# Patient Record
Sex: Female | Born: 1955 | Race: White | Hispanic: No | Smoking: Never smoker
Health system: Southern US, Community
[De-identification: ages and names within clinical notes are randomized; demographics above are authoritative.]

## PROBLEM LIST (undated history)

## (undated) DIAGNOSIS — E785 Hyperlipidemia, unspecified: Secondary | ICD-10-CM

## (undated) DIAGNOSIS — F419 Anxiety disorder, unspecified: Secondary | ICD-10-CM

## (undated) DIAGNOSIS — K219 Gastro-esophageal reflux disease without esophagitis: Secondary | ICD-10-CM

## (undated) DIAGNOSIS — H3530 Unspecified macular degeneration: Secondary | ICD-10-CM

## (undated) HISTORY — PX: ABDOMINAL HYSTERECTOMY: SHX81

## (undated) HISTORY — PX: COLONOSCOPY: SHX174

## (undated) HISTORY — PX: BREAST SURGERY: SHX581

## (undated) HISTORY — PX: CHOLECYSTECTOMY: SHX55

---

## 2016-09-10 ENCOUNTER — Emergency Department (HOSPITAL_BASED_OUTPATIENT_CLINIC_OR_DEPARTMENT_OTHER)
Admission: EM | Admit: 2016-09-10 | Discharge: 2016-09-10 | Disposition: A | Discharge status: Home / Self Care | Payer: Medicaid Other | Attending: Emergency Medicine | Admitting: Emergency Medicine

## 2016-09-10 ENCOUNTER — Emergency Department (HOSPITAL_BASED_OUTPATIENT_CLINIC_OR_DEPARTMENT_OTHER): Payer: Medicaid Other

## 2016-09-10 ENCOUNTER — Encounter (HOSPITAL_BASED_OUTPATIENT_CLINIC_OR_DEPARTMENT_OTHER): Payer: Self-pay | Admitting: *Deleted

## 2016-09-10 DIAGNOSIS — R0981 Nasal congestion: Secondary | ICD-10-CM

## 2016-09-10 DIAGNOSIS — Z79899 Other long term (current) drug therapy: Secondary | ICD-10-CM | POA: Diagnosis not present

## 2016-09-10 DIAGNOSIS — R209 Unspecified disturbances of skin sensation: Secondary | ICD-10-CM | POA: Diagnosis not present

## 2016-09-10 DIAGNOSIS — R4182 Altered mental status, unspecified: Secondary | ICD-10-CM | POA: Diagnosis present

## 2016-09-10 HISTORY — DX: Unspecified macular degeneration: H35.30

## 2016-09-10 HISTORY — DX: Anxiety disorder, unspecified: F41.9

## 2016-09-10 HISTORY — DX: Hyperlipidemia, unspecified: E78.5

## 2016-09-10 LAB — CBC WITH DIFFERENTIAL/PLATELET
BASOS ABS: 0 10*3/uL (ref 0.0–0.1)
BASOS PCT: 0 %
Eosinophils Absolute: 0.1 10*3/uL (ref 0.0–0.7)
Eosinophils Relative: 1 %
HEMATOCRIT: 39.6 % (ref 36.0–46.0)
Hemoglobin: 13.2 g/dL (ref 12.0–15.0)
Lymphocytes Relative: 17 %
Lymphs Abs: 1.9 10*3/uL (ref 0.7–4.0)
MCH: 29.7 pg (ref 26.0–34.0)
MCHC: 33.3 g/dL (ref 30.0–36.0)
MCV: 89.2 fL (ref 78.0–100.0)
Monocytes Absolute: 0.6 10*3/uL (ref 0.1–1.0)
Monocytes Relative: 5 %
NEUTROS ABS: 8.6 10*3/uL — AB (ref 1.7–7.7)
NEUTROS PCT: 77 %
Platelets: 244 10*3/uL (ref 150–400)
RBC: 4.44 MIL/uL (ref 3.87–5.11)
RDW: 13.8 % (ref 11.5–15.5)
WBC: 11.2 10*3/uL — AB (ref 4.0–10.5)

## 2016-09-10 LAB — URINALYSIS, ROUTINE W REFLEX MICROSCOPIC
Bilirubin Urine: NEGATIVE
Glucose, UA: NEGATIVE mg/dL
Hgb urine dipstick: NEGATIVE
Ketones, ur: NEGATIVE mg/dL
Leukocytes, UA: NEGATIVE
Nitrite: NEGATIVE
Protein, ur: NEGATIVE mg/dL
Specific Gravity, Urine: 1.021 (ref 1.005–1.030)
pH: 6 (ref 5.0–8.0)

## 2016-09-10 LAB — ETHANOL

## 2016-09-10 LAB — COMPREHENSIVE METABOLIC PANEL WITH GFR
ALT: 19 U/L (ref 14–54)
AST: 18 U/L (ref 15–41)
Albumin: 3.9 g/dL (ref 3.5–5.0)
Alkaline Phosphatase: 112 U/L (ref 38–126)
Anion gap: 7 (ref 5–15)
BUN: 14 mg/dL (ref 6–20)
CO2: 28 mmol/L (ref 22–32)
Calcium: 9.1 mg/dL (ref 8.9–10.3)
Chloride: 104 mmol/L (ref 101–111)
Creatinine, Ser: 0.76 mg/dL (ref 0.44–1.00)
GFR calc Af Amer: >60 mL/min (ref >60)
GFR calc non Af Amer: >60 mL/min (ref >60)
Glucose, Bld: 109 mg/dL — ABNORMAL HIGH (ref 65–99)
Potassium: 4.3 mmol/L (ref 3.5–5.1)
Sodium: 139 mmol/L (ref 135–145)
Total Bilirubin: 0.2 mg/dL — ABNORMAL LOW (ref 0.3–1.2)
Total Protein: 7.6 g/dL (ref 6.5–8.1)

## 2016-09-10 LAB — RAPID URINE DRUG SCREEN, HOSP PERFORMED
AMPHETAMINES: NOT DETECTED
BARBITURATES: NOT DETECTED
BENZODIAZEPINES: NOT DETECTED
Cocaine: NOT DETECTED
Opiates: NOT DETECTED
Tetrahydrocannabinol: NOT DETECTED

## 2016-09-10 LAB — TROPONIN I: Troponin I: <0.03 ng/mL (ref <0.03)

## 2016-09-10 LAB — MAGNESIUM: Magnesium: 2.1 mg/dL (ref 1.7–2.4)

## 2016-09-10 LAB — CK: Total CK: 60 U/L (ref 38–234)

## 2016-09-10 MED ORDER — LORATADINE 10 MG PO TABS: 10.0000 mg | ORAL_TABLET | Freq: Every day | ORAL | 0 refills | Status: DC

## 2016-09-10 MED ORDER — FLUTICASONE PROPIONATE 50 MCG/ACT NA SUSP: 2.0000 | Freq: Every day | NASAL | 0 refills | Status: DC

## 2016-09-10 NOTE — ED Notes (Signed)
Pt sts she has not had confusion; she felt overwhelmed by all the questions the UC staff was asking her and she just moved to this area, so she can't remember the names of everything yet.

## 2016-09-10 NOTE — ED Triage Notes (Signed)
Pt c/o confusion x 1 week , with hx of same . Pt is alert oriented x 4. Sent here from [facility]

## 2016-09-10 NOTE — ED Provider Notes (Signed)
[place] DEPT [facility] Provider Note   CSN: [id number] Arrival date & time: 09/10/16  1309     History   Chief Complaint Chief Complaint  Patient presents with  . Altered Mental Status    HPI Shelly Beltran is a 61 y.o. female.  HPI Patient states she moved to the area roughly one week ago. She's had "moving sensation" to the left side of her body for the past week. States this is a chronic problem that normally occurs once or twice a month. States she was having the sensation until she arrived to the emergency department. She denies any numbness or weakness. No bug crawling sensation or pins and needle sensation. Denies fever or chills. Denies visual changes, speech changes. No vomiting or diarrhea. No urinary symptoms. Patient states she does have fatigue but this is generalized. Started on Lipitor 9 months ago. No other medications.  Patient admits to some nasal congestion and fullness in sinuses and bilateral ears. Past Medical History:  Diagnosis Date  . Anxiety   . Hyperlipidemia   . Macular degeneration syndrome     There are no active problems to display for this patient.   Past Surgical History:  Procedure Laterality Date  . ABDOMINAL HYSTERECTOMY    . BREAST SURGERY    . CESAREAN SECTION    . CHOLECYSTECTOMY      OB History    No data available       Home Medications    Prior to Admission medications   Medication Sig Start Date End Date Taking? Authorizing Provider  atorvastatin (LIPITOR) 20 MG tablet Take 20 mg by mouth daily.   Yes [provider]  ergocalciferol (VITAMIN D2) 50000 units capsule Take 50,000 Units by mouth once a week.   Yes [provider]  sertraline (ZOLOFT) 25 MG tablet Take 25 mg by mouth daily.   Yes [provider]  fluticasone (FLONASE) 50 MCG/ACT nasal spray Place 2 sprays into both nostrils daily. 09/10/16   Julianne Rice, MD  loratadine (CLARITIN) 10 MG tablet Take 1 tablet (10 mg total) by  mouth daily. 09/10/16   Julianne Rice, MD    Family History No family history on file.  Social History Social History  Substance Use Topics  . Smoking status: Not on file  . Smokeless tobacco: Not on file  . Alcohol use Not on file     Allergies   Patient has no known allergies.   Review of Systems Review of Systems  Constitutional: Positive for fatigue. Negative for appetite change, chills and fever.  HENT: Positive for congestion, sinus pain and sinus pressure. Negative for rhinorrhea and sore throat.   Eyes: Negative for visual disturbance.  Respiratory: Negative for cough, chest tightness, shortness of breath and wheezing.   Cardiovascular: Negative for chest pain, palpitations and leg swelling.  Gastrointestinal: Negative for abdominal pain, constipation, diarrhea, nausea and vomiting.  Genitourinary: Negative for dysuria, flank pain, frequency and hematuria.  Musculoskeletal: Negative for back pain, myalgias, neck pain and neck stiffness.  Skin: Negative for rash and wound.  Neurological: Negative for dizziness, speech difficulty, weakness, light-headedness, numbness and headaches.  All other systems reviewed and are negative.    Physical Exam Updated Vital Signs BP (!) 139/98   Pulse 65   Temp 98.1 F (36.7 C) (Oral)   Resp (!) 23   SpO2 96%   Physical Exam  Constitutional: She is oriented to person, place, and time. She appears well-developed and well-nourished.  Appears anxious  HENT:  Head: Normocephalic and atraumatic.  Mouth/Throat: Oropharynx is clear and moist. No oropharyngeal exudate.  Left greater than right nasal mucosal edema.  Eyes: EOM are normal. Pupils are equal, round, and reactive to light.  Neck: Normal range of motion. Neck supple.  No meningismus.  Cardiovascular: Normal rate and regular rhythm.  Exam reveals no gallop and no friction rub.   No murmur heard. Pulmonary/Chest: Effort normal and breath sounds normal. No respiratory  distress. She has no wheezes. She has no rales. She exhibits no tenderness.  Abdominal: Soft. Bowel sounds are normal. There is no tenderness. There is no rebound and no guarding.  Musculoskeletal: Normal range of motion. She exhibits no edema, tenderness or deformity.  No midline thoracic or lumbar tenderness to palpation. No CVA tenderness bilaterally. No lower extremity swelling or asymmetry.  Lymphadenopathy:    She has no cervical adenopathy.  Neurological: She is alert and oriented to person, place, and time.  Patient is alert and oriented x3 with clear, goal oriented speech. Patient has 5/5 motor in all extremities. Sensation is intact to light touch. Bilateral finger-to-nose is normal with no signs of dysmetria. Patient has a normal gait and walks without assistance.  Skin: Skin is warm and dry. Capillary refill takes less than 2 seconds. No rash noted. She is not diaphoretic. No erythema.  Psychiatric: Her behavior is normal.  Nursing note and vitals reviewed.    ED Treatments / Results  Labs (all labs ordered are listed, but only abnormal results are displayed) Labs Reviewed  CBC WITH DIFFERENTIAL/PLATELET - Abnormal; Notable for the following:       Result Value   WBC 11.2 (*)    Neutro Abs 8.6 (*)    All other components within normal limits  COMPREHENSIVE METABOLIC PANEL - Abnormal; Notable for the following:    Glucose, Bld 109 (*)    Total Bilirubin 0.2 (*)    All other components within normal limits  TROPONIN I  URINALYSIS, ROUTINE W REFLEX MICROSCOPIC  RAPID URINE DRUG SCREEN, HOSP PERFORMED  ETHANOL  CK  MAGNESIUM    EKG  EKG Interpretation  Date/Time:  Friday Sep 10 2016 14:50:37 EDT Ventricular Rate:  59 PR Interval:    QRS Duration: 74 QT Interval:  382 QTC Calculation: 379 R Axis:   42 Text Interpretation:  Sinus rhythm Confirmed by Lita Mains  MD, Fareeha Evon ([id number]) on 09/10/2016 3:34:15 PM       Radiology Dg Chest 2 View  Result Date:  09/10/2016 CLINICAL DATA:  Chest discomfort for a month EXAM: CHEST  2 VIEW COMPARISON:  None. FINDINGS: The heart size and mediastinal contours are within normal limits. Both lungs are clear. The visualized skeletal structures are unremarkable. IMPRESSION: No active cardiopulmonary disease. Electronically Signed   By: Kathreen Devoid   On: 09/10/2016 14:53    Procedures Procedures (including critical care time)  Medications Ordered in ED Medications - No data to display   Initial Impression / Assessment and Plan / ED Course  I have reviewed the triage vital signs and the nursing notes.  Pertinent labs & imaging results that were available during my care of the patient were reviewed by me and considered in my medical decision making (see chart for details).     Mild elevation in white blood cell count of uncertain significance. Possibly related to sinusitis. Really for the patient's sensory disturbance, electrolytes are normal. She has a normal neurologic exam. Question whether this may be anxiety related. Chest warm  to follow-up with her primary physician and will give neurology consult. Return precautions have been given.  Final Clinical Impressions(s) / ED Diagnoses   Final diagnoses:  Nasal sinus congestion  Sensory disturbance    New Prescriptions New Prescriptions   FLUTICASONE (FLONASE) 50 MCG/ACT NASAL SPRAY    Place 2 sprays into both nostrils daily.   LORATADINE (CLARITIN) 10 MG TABLET    Take 1 tablet (10 mg total) by mouth daily.     Julianne Rice, MD 09/10/16 [phone]

## 2016-10-07 ENCOUNTER — Encounter (HOSPITAL_COMMUNITY): Payer: Self-pay | Admitting: Emergency Medicine

## 2016-10-07 ENCOUNTER — Emergency Department (HOSPITAL_COMMUNITY): Payer: Medicaid Other

## 2016-10-07 ENCOUNTER — Emergency Department (HOSPITAL_COMMUNITY)
Admission: EM | Admit: 2016-10-07 | Discharge: 2016-10-08 | Disposition: A | Discharge status: Home / Self Care | Payer: Medicaid Other | Attending: Emergency Medicine | Admitting: Emergency Medicine

## 2016-10-07 ENCOUNTER — Other Ambulatory Visit: Payer: Self-pay

## 2016-10-07 DIAGNOSIS — R1013 Epigastric pain: Secondary | ICD-10-CM | POA: Diagnosis not present

## 2016-10-07 DIAGNOSIS — Z79899 Other long term (current) drug therapy: Secondary | ICD-10-CM | POA: Insufficient documentation

## 2016-10-07 DIAGNOSIS — R0789 Other chest pain: Secondary | ICD-10-CM | POA: Diagnosis not present

## 2016-10-07 DIAGNOSIS — R079 Chest pain, unspecified: Secondary | ICD-10-CM

## 2016-10-07 DIAGNOSIS — R42 Dizziness and giddiness: Secondary | ICD-10-CM | POA: Diagnosis present

## 2016-10-07 LAB — CBC
HCT: 40.2 % (ref 36.0–46.0)
Hemoglobin: 13.2 g/dL (ref 12.0–15.0)
MCH: 29.2 pg (ref 26.0–34.0)
MCHC: 32.8 g/dL (ref 30.0–36.0)
MCV: 88.9 fL (ref 78.0–100.0)
Platelets: 246 10*3/uL (ref 150–400)
RBC: 4.52 MIL/uL (ref 3.87–5.11)
RDW: 13.5 % (ref 11.5–15.5)
WBC: 12.5 10*3/uL — ABNORMAL HIGH (ref 4.0–10.5)

## 2016-10-07 LAB — BASIC METABOLIC PANEL
Anion gap: 7 (ref 5–15)
BUN: 20 mg/dL (ref 6–20)
CALCIUM: 9.2 mg/dL (ref 8.9–10.3)
CO2: 24 mmol/L (ref 22–32)
CREATININE: 0.85 mg/dL (ref 0.44–1.00)
Chloride: 110 mmol/L (ref 101–111)
GFR calc Af Amer: >60 mL/min (ref >60)
GLUCOSE: 130 mg/dL — AB (ref 65–99)
Potassium: 3.8 mmol/L (ref 3.5–5.1)
Sodium: 141 mmol/L (ref 135–145)

## 2016-10-07 LAB — POCT I-STAT TROPONIN I
TROPONIN I, POC: 0 ng/mL (ref 0.00–0.08)
Troponin i, poc: 0 ng/mL (ref 0.00–0.08)

## 2016-10-07 NOTE — ED Provider Notes (Signed)
[place] DEPT Provider Note   CSN: [id number] Arrival date & time: 10/07/16  1853     History   Chief Complaint Chief Complaint  Patient presents with  . Dizziness  . Chest Pain    HPI Shelly Beltran is a 61 y.o. female.  Patient with a history of HTN, HLD, known carotid stenosis ("50% on left),  presents with symptoms of substernal burning sensation. She describes brief episodes last seconds and are associated with dizziness. She has been belching more today with a remote history of reflux. She denies SOB, nausea, diaphoresis. She is cnocerned that symptoms are heart related and that she may be having a heart attack. She has had similar symptoms in the past but reports today that they have been frequent where before she may have one episode. There are no aggravating or alleviating factors. She is currently pain free.   The history is provided by the patient. No language interpreter was used.  Dizziness  Associated symptoms: chest pain   Associated symptoms: no headaches, no nausea, no shortness of breath and no vomiting   Chest Pain   Associated symptoms include dizziness. Pertinent negatives include no abdominal pain, no fever, no headaches, no nausea, no shortness of breath and no vomiting.    Past Medical History:  Diagnosis Date  . Anxiety   . Hyperlipidemia   . Macular degeneration syndrome     There are no active problems to display for this patient.   Past Surgical History:  Procedure Laterality Date  . ABDOMINAL HYSTERECTOMY    . BREAST SURGERY    . CESAREAN SECTION    . CHOLECYSTECTOMY      OB History    No data available       Home Medications    Prior to Admission medications   Medication Sig Start Date End Date Taking? Authorizing Provider  atorvastatin (LIPITOR) 20 MG tablet Take 20 mg by mouth at bedtime.    Yes [provider]  fluticasone (FLONASE) 50 MCG/ACT nasal spray Place 2 sprays into both nostrils daily. Patient taking  differently: Place 2 sprays into both nostrils daily as needed for rhinitis.  09/10/16  Yes Julianne Rice, MD  Multiple Vitamin (MULTIVITAMIN WITH MINERALS) TABS tablet Take 1 tablet by mouth daily.   Yes [provider]  sertraline (ZOLOFT) 50 MG tablet Take 50 mg by mouth daily.   Yes [provider]    Family History History reviewed. No pertinent family history.  Social History Social History  Substance Use Topics  . Smoking status: Never Smoker  . Smokeless tobacco: Never Used  . Alcohol use Yes     Comment: social     Allergies   Patient has no known allergies.   Review of Systems Review of Systems  Constitutional: Negative for activity change, chills and fever.  HENT: Negative.   Respiratory: Negative.  Negative for shortness of breath.   Cardiovascular: Positive for chest pain.  Gastrointestinal: Negative.  Negative for abdominal pain, nausea and vomiting.  Musculoskeletal: Negative.   Skin: Negative.   Neurological: Positive for dizziness. Negative for headaches.     Physical Exam Updated Vital Signs BP (!) 143/92 (BP Location: Left Arm)   Pulse 71   Temp 98.7 F (37.1 C) (Oral)   Resp 14   Ht 5\' 1"  (1.549 m)   Wt 81.9 kg (180 lb 9.6 oz)   SpO2 98%   BMI 34.12 kg/m   Physical Exam  Constitutional: She appears well-developed  and well-nourished.  HENT:  Head: Normocephalic.  Neck: Normal range of motion. Neck supple.  Cardiovascular: Normal rate and regular rhythm.   No murmur heard. Pulmonary/Chest: Effort normal and breath sounds normal. She has no wheezes. She has no rales.  Abdominal: Soft. Bowel sounds are normal. There is no tenderness. There is no rebound and no guarding.  Musculoskeletal: Normal range of motion. She exhibits no edema.  Neurological: She is alert.  CN's 3-12 grossly intact. Speech is clear and focused. No facial asymmetry. No deficits of coordination. Ambulatory without imbalance.    Skin: Skin is warm and  dry. No rash noted.  Psychiatric: She has a normal mood and affect.     ED Treatments / Results  Labs (all labs ordered are listed, but only abnormal results are displayed) Labs Reviewed  BASIC METABOLIC PANEL - Abnormal; Notable for the following:       Result Value   Glucose, Bld 130 (*)    All other components within normal limits  CBC - Abnormal; Notable for the following:    WBC 12.5 (*)    All other components within normal limits  I-STAT TROPOININ, ED  POCT I-STAT TROPONIN I   Results for orders placed or performed during the hospital encounter of 78/29/56  Basic metabolic panel  Result Value Ref Range   Sodium 141 135 - 145 mmol/L   Potassium 3.8 3.5 - 5.1 mmol/L   Chloride 110 101 - 111 mmol/L   CO2 24 22 - 32 mmol/L   Glucose, Bld 130 (H) 65 - 99 mg/dL   BUN 20 6 - 20 mg/dL   Creatinine, Ser 0.85 0.44 - 1.00 mg/dL   Calcium 9.2 8.9 - 10.3 mg/dL   GFR calc non Af Amer >60 >60 mL/min   GFR calc Af Amer >60 >60 mL/min   Anion gap 7 5 - 15  CBC  Result Value Ref Range   WBC 12.5 (H) 4.0 - 10.5 K/uL   RBC 4.52 3.87 - 5.11 MIL/uL   Hemoglobin 13.2 12.0 - 15.0 g/dL   HCT 40.2 36.0 - 46.0 %   MCV 88.9 78.0 - 100.0 fL   MCH 29.2 26.0 - 34.0 pg   MCHC 32.8 30.0 - 36.0 g/dL   RDW 13.5 11.5 - 15.5 %   Platelets 246 150 - 400 K/uL  POCT i-Stat troponin I  Result Value Ref Range   Troponin i, poc 0.00 0.00 - 0.08 ng/mL   Comment 3            EKG  EKG Interpretation None       Radiology Dg Chest 2 View  Result Date: 10/07/2016 CLINICAL DATA:  Burning sensation in chest multiple times today since 1300 hours, associated dizziness EXAM: CHEST  2 VIEW COMPARISON:  09/10/2016 FINDINGS: Normal heart size, mediastinal contours, and pulmonary vascularity. Minimal peribronchial thickening. No pulmonary infiltrate, pleural effusion, or pneumothorax. Bones unremarkable. IMPRESSION: Minimal bronchitic changes without infiltrate. Electronically Signed   By: Lavonia Dana M.D.    On: 10/07/2016 19:54    Procedures Procedures (including critical care time)  Medications Ordered in ED Medications - No data to display   Initial Impression / Assessment and Plan / ED Course  I have reviewed the triage vital signs and the nursing notes.  Pertinent labs & imaging results that were available during my care of the patient were reviewed by me and considered in my medical decision making (see chart for details).     Patient  presents with complaint of burning sensation in her chest that has been episodic, brief in duration and associated with dizziness.   Symptoms are not typical for cardiac origin. Troponin negative, CXR clear. EKG has no evidence of ischemic change. The patient is currently asymptomatic.   Delta troponin and repeat EKG are unchanged. The patient was given a GI cocktail with minor improvement in symptoms. She is felt appropriate for discharge home with PCP follow up.  Final Clinical Impressions(s) / ED Diagnoses   Final diagnoses:  None   1. Nonspecific chest pain 2. Dyspepsia  New Prescriptions New Prescriptions   No medications on file     Charlann Lange, Hershal Coria 10/08/16 [id number]    Gareth Morgan, MD 10/08/16 1427

## 2016-10-07 NOTE — ED Notes (Signed)
Pt is having intermittent episodes of "feeling dizzy and still" as well as burning epigastric pain that is "moving." Pt is also worried about her artery that is about 50% occluded, and is going to see her PCP for a scan

## 2016-10-07 NOTE — ED Triage Notes (Signed)
Pt reports having a burning sensation in chest multiple times today starting at 1300 along with dizziness at same time. Pt reports prior hx of having 50% blockage to left carotid artery.

## 2016-10-08 MED ORDER — GI COCKTAIL ~~LOC~~
30.0000 mL | Freq: Once | ORAL | Status: AC
  Administered 2016-10-08: 30 mL via ORAL
  Filled 2016-10-08: qty 30

## 2016-10-08 MED ORDER — OMEPRAZOLE 20 MG PO CPDR: 20.0000 mg | DELAYED_RELEASE_CAPSULE | Freq: Every day | ORAL | 0 refills | Status: DC

## 2016-10-08 NOTE — Discharge Instructions (Signed)
Please follow up with your doctor for recheck of current symptoms. Recommend Prilosec daily symptom relief. Return to the emergency department with any new or concerning symptoms.

## 2017-02-14 ENCOUNTER — Ambulatory Visit (HOSPITAL_COMMUNITY)
Admission: EM | Admit: 2017-02-14 | Discharge: 2017-02-14 | Disposition: A | Discharge status: Home / Self Care | Payer: Medicaid Other | Attending: Family Medicine | Admitting: Family Medicine

## 2017-02-14 ENCOUNTER — Encounter (HOSPITAL_COMMUNITY): Payer: Self-pay | Admitting: Emergency Medicine

## 2017-02-14 DIAGNOSIS — T754XXA Electrocution, initial encounter: Secondary | ICD-10-CM | POA: Diagnosis not present

## 2017-02-14 MED ORDER — SILVER SULFADIAZINE 1 % EX CREA: 1.0000 "application " | TOPICAL_CREAM | Freq: Every day | CUTANEOUS | 0 refills | Status: DC
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
## 2017-02-17 ENCOUNTER — Encounter (HOSPITAL_COMMUNITY): Payer: Self-pay | Admitting: Emergency Medicine

## 2017-02-17 ENCOUNTER — Ambulatory Visit (HOSPITAL_COMMUNITY)
Admission: EM | Admit: 2017-02-17 | Discharge: 2017-02-17 | Disposition: A | Discharge status: Home / Self Care | Payer: Medicaid Other | Attending: Emergency Medicine | Admitting: Emergency Medicine

## 2017-02-17 ENCOUNTER — Ambulatory Visit (INDEPENDENT_AMBULATORY_CARE_PROVIDER_SITE_OTHER): Payer: Medicaid Other

## 2017-02-17 DIAGNOSIS — M25521 Pain in right elbow: Secondary | ICD-10-CM

## 2017-02-17 NOTE — Discharge Instructions (Signed)
Negative X-Ray today. Rest of the right arm, ice application, ACE wrap if needed, ibuprofen (take with food). If pain continues to increase, develop swelling, worsening of numbness or tingling please be seen immediately. If symptoms persist continue to follow with your primary care provider as needed.
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
## 2017-04-19 ENCOUNTER — Encounter (HOSPITAL_COMMUNITY): Payer: Self-pay | Admitting: *Deleted

## 2017-04-19 ENCOUNTER — Ambulatory Visit (HOSPITAL_COMMUNITY)
Admission: EM | Admit: 2017-04-19 | Discharge: 2017-04-19 | Disposition: A | Discharge status: Home / Self Care | Payer: Medicaid Other | Attending: Physician Assistant | Admitting: Physician Assistant

## 2017-04-19 ENCOUNTER — Other Ambulatory Visit: Payer: Self-pay

## 2017-04-19 DIAGNOSIS — L089 Local infection of the skin and subcutaneous tissue, unspecified: Secondary | ICD-10-CM

## 2017-04-19 DIAGNOSIS — R002 Palpitations: Secondary | ICD-10-CM | POA: Diagnosis not present

## 2017-04-19 LAB — POCT I-STAT, CHEM 8
BUN: 17 mg/dL (ref 6–20)
CHLORIDE: 105 mmol/L (ref 101–111)
CREATININE: 0.6 mg/dL (ref 0.44–1.00)
Calcium, Ion: 1.21 mmol/L (ref 1.15–1.40)
Glucose, Bld: 104 mg/dL — ABNORMAL HIGH (ref 65–99)
HEMATOCRIT: 42 % (ref 36.0–46.0)
Hemoglobin: 14.3 g/dL (ref 12.0–15.0)
POTASSIUM: 3.9 mmol/L (ref 3.5–5.1)
Sodium: 141 mmol/L (ref 135–145)
TCO2: 24 mmol/L (ref 22–32)

## 2017-04-19 MED ORDER — DOXYCYCLINE HYCLATE 100 MG PO CAPS: 100.0000 mg | ORAL_CAPSULE | Freq: Two times a day (BID) | ORAL | 0 refills | Status: AC

## 2017-04-19 NOTE — ED Triage Notes (Signed)
Per pt her head feels "stopped up". Per pt left side of her chest is fluttering which keeps coming and going,

## 2017-04-19 NOTE — Discharge Instructions (Signed)
Call the cardiologist to set up an appointment.

## 2017-04-19 NOTE — ED Provider Notes (Signed)
04/19/2017 6:51 PM   DOB: April 30, 1955 / MRN: [id number]  SUBJECTIVE:  Shelly Beltran is a 62 y.o. female presenting for palpitations.  She denies chest pain and shortness of breath with these.  She can walk without difficulty.  She had a son die at 66 years of age due to an enlarged heart. No difficulty with ambulation with regards to SOB.   She complains of some places on her breast that have been bothering her for weeks.  They are painful and she has been applying neosporin to this without improvement.     She has No Known Allergies.   She  has a past medical history of Anxiety, Hyperlipidemia, and Macular degeneration syndrome.    She  reports that  has never smoked. she has never used smokeless tobacco. She reports that she drinks alcohol. She reports that she does not use drugs. She  has no sexual activity history on file. The patient  has a past surgical history that includes Cesarean section; Breast surgery; Cholecystectomy; and Abdominal hysterectomy.  Her family history is not on file.  Review of Systems  Constitutional: Negative for chills and fever.  Cardiovascular: Positive for palpitations. Negative for chest pain, orthopnea, claudication, leg swelling and PND.  Skin: Positive for rash. Negative for itching.    OBJECTIVE:  BP 135/68 (BP Location: Left Arm)   Pulse 80   Temp 98 F (36.7 C) (Oral)   SpO2 98%   Physical Exam  Constitutional: She is active.  Non-toxic appearance.  Cardiovascular: Normal rate, regular rhythm, S1 normal, S2 normal, normal heart sounds and intact distal pulses. Exam reveals no gallop, no friction rub and no decreased pulses.  No murmur heard. Pulmonary/Chest: Effort normal. No stridor. No tachypnea. No respiratory distress. She has no wheezes. She has no rales.    Abdominal: She exhibits no distension.  Musculoskeletal: She exhibits no edema.  Neurological: She is alert.  Skin: Skin is warm and dry. She is not diaphoretic. No pallor.    No  results found for this or any previous visit (from the past 72 hour(s)).  No results found.  ASSESSMENT AND PLAN:  The primary encounter diagnosis was Palpitations. A diagnosis of Pustules determined by examination was also pertinent to this visit.    The patient is advised to call or return to clinic if she does not see an improvement in symptoms, or to seek the care of the closest emergency department if she worsens with the above plan.   Philis Fendt, MHS, PA-C 04/19/2017 6:51 PM    Tereasa Coop, PA-C 04/19/17 [phone]

## 2017-06-29 ENCOUNTER — Other Ambulatory Visit (HOSPITAL_COMMUNITY): Payer: Self-pay | Admitting: Oculoplastics Ophthalmology

## 2017-06-29 DIAGNOSIS — H0589 Other disorders of orbit: Secondary | ICD-10-CM

## 2017-07-04 ENCOUNTER — Other Ambulatory Visit (HOSPITAL_COMMUNITY): Payer: Self-pay | Admitting: Oculoplastics Ophthalmology

## 2017-07-04 DIAGNOSIS — H0589 Other disorders of orbit: Secondary | ICD-10-CM

## 2017-07-08 ENCOUNTER — Ambulatory Visit (HOSPITAL_COMMUNITY): Payer: Medicaid Other

## 2017-07-12 ENCOUNTER — Ambulatory Visit (HOSPITAL_COMMUNITY): Payer: Medicaid Other

## 2017-07-19 ENCOUNTER — Ambulatory Visit (HOSPITAL_COMMUNITY): Payer: Medicaid Other

## 2017-07-28 ENCOUNTER — Other Ambulatory Visit (HOSPITAL_COMMUNITY): Payer: Self-pay | Admitting: Oculoplastics Ophthalmology

## 2017-07-28 ENCOUNTER — Ambulatory Visit (HOSPITAL_COMMUNITY)
Admission: RE | Admit: 2017-07-28 | Discharge: 2017-07-28 | Disposition: A | Discharge status: Home / Self Care | Payer: Medicare Other | Source: Ambulatory Visit | Attending: Oculoplastics Ophthalmology | Admitting: Oculoplastics Ophthalmology

## 2017-07-28 DIAGNOSIS — H0589 Other disorders of orbit: Secondary | ICD-10-CM

## 2017-07-28 LAB — POCT I-STAT CREATININE: Creatinine, Ser: 0.6 mg/dL (ref 0.44–1.00)

## 2017-07-28 MED ORDER — IOPAMIDOL (ISOVUE-M 300) INJECTION 61%: 15.0000 mL | Freq: Once | INTRAMUSCULAR | Status: DC | PRN

## 2017-07-28 MED ORDER — IOPAMIDOL (ISOVUE-300) INJECTION 61%
75.0000 mL | Freq: Once | INTRAVENOUS | Status: AC | PRN
  Administered 2017-07-28: 75 mL via INTRAVENOUS

## 2017-07-28 MED ORDER — IOPAMIDOL (ISOVUE-300) INJECTION 61%
INTRAVENOUS | Status: AC
  Filled 2017-07-28: qty 100

## 2017-09-21 ENCOUNTER — Other Ambulatory Visit: Payer: Self-pay

## 2017-09-21 ENCOUNTER — Encounter (HOSPITAL_COMMUNITY): Payer: Self-pay | Admitting: *Deleted

## 2017-09-21 NOTE — Progress Notes (Signed)
Spoke with pt for pre-op call. Pt denies cardiac history, HTN, or diabetes. Pt states she was seen by her internal medicine doctor yesterday for constipation and possible carotid artery stenosis. Notes by her physician state that pt came in thinking she had been told she had stenosis, but her physician can't find where she had a dopplers done in the past and pt can't remember where it was. In 9 notes he states he does not hear a bruit and she has no symptoms of stenosis. They plan to have her have dopplers done in the future.

## 2017-09-22 ENCOUNTER — Encounter (HOSPITAL_COMMUNITY): Payer: Self-pay

## 2017-09-22 ENCOUNTER — Ambulatory Visit (HOSPITAL_COMMUNITY)
Admission: RE | Admit: 2017-09-22 | Discharge: 2017-09-22 | Disposition: A | Discharge status: Home / Self Care | Payer: Medicare Other | Source: Ambulatory Visit | Attending: Oculoplastics Ophthalmology | Admitting: Oculoplastics Ophthalmology

## 2017-09-22 ENCOUNTER — Ambulatory Visit (HOSPITAL_COMMUNITY): Payer: Medicare Other | Admitting: Certified Registered"

## 2017-09-22 ENCOUNTER — Encounter (HOSPITAL_COMMUNITY)
Admission: RE | Disposition: A | Discharge status: Home / Self Care | Payer: Self-pay | Source: Ambulatory Visit | Attending: Oculoplastics Ophthalmology

## 2017-09-22 DIAGNOSIS — Z79899 Other long term (current) drug therapy: Secondary | ICD-10-CM | POA: Diagnosis not present

## 2017-09-22 DIAGNOSIS — D170 Benign lipomatous neoplasm of skin and subcutaneous tissue of head, face and neck: Secondary | ICD-10-CM | POA: Insufficient documentation

## 2017-09-22 DIAGNOSIS — K219 Gastro-esophageal reflux disease without esophagitis: Secondary | ICD-10-CM | POA: Diagnosis not present

## 2017-09-22 HISTORY — PX: RECONSTRUCTION OF EYELID: SHX6576

## 2017-09-22 HISTORY — DX: Gastro-esophageal reflux disease without esophagitis: K21.9

## 2017-09-22 HISTORY — PX: ORBITAL LESION EXCISION: SHX6682

## 2017-09-22 LAB — BASIC METABOLIC PANEL
ANION GAP: 8 (ref 5–15)
BUN: 15 mg/dL (ref 6–20)
CHLORIDE: 110 mmol/L (ref 101–111)
CO2: 24 mmol/L (ref 22–32)
CREATININE: 0.67 mg/dL (ref 0.44–1.00)
Calcium: 8.8 mg/dL — ABNORMAL LOW (ref 8.9–10.3)
GFR calc non Af Amer: >60 mL/min (ref >60)
Glucose, Bld: 100 mg/dL — ABNORMAL HIGH (ref 65–99)
Potassium: 3.7 mmol/L (ref 3.5–5.1)
SODIUM: 142 mmol/L (ref 135–145)

## 2017-09-22 LAB — PROTIME-INR
INR: 1.13
Prothrombin Time: 14.4 seconds (ref 11.4–15.2)

## 2017-09-22 LAB — CBC
HEMATOCRIT: 41.4 % (ref 36.0–46.0)
HEMOGLOBIN: 13.2 g/dL (ref 12.0–15.0)
MCH: 28.1 pg (ref 26.0–34.0)
MCHC: 31.9 g/dL (ref 30.0–36.0)
MCV: 88.3 fL (ref 78.0–100.0)
Platelets: 218 10*3/uL (ref 150–400)
RBC: 4.69 MIL/uL (ref 3.87–5.11)
RDW: 12.9 % (ref 11.5–15.5)
WBC: 8.9 10*3/uL (ref 4.0–10.5)

## 2017-09-22 SURGERY — RECONSTRUCTION, EYELID
Anesthesia: General | Site: Eye | Laterality: Right

## 2017-09-22 MED ORDER — HYDROMORPHONE HCL 2 MG/ML IJ SOLN: 0.2500 mg | INTRAMUSCULAR | Status: DC | PRN

## 2017-09-22 MED ORDER — LIDOCAINE 2% (20 MG/ML) 5 ML SYRINGE
INTRAMUSCULAR | Status: AC
Start: 2017-09-22 — End: ?
  Filled 2017-09-22: qty 5

## 2017-09-22 MED ORDER — PROPOFOL 10 MG/ML IV BOLUS
INTRAVENOUS | Status: AC
  Filled 2017-09-22: qty 20

## 2017-09-22 MED ORDER — MIDAZOLAM HCL 5 MG/5ML IJ SOLN
INTRAMUSCULAR | Status: DC | PRN
  Administered 2017-09-22: 2 mg via INTRAVENOUS

## 2017-09-22 MED ORDER — TOBRAMYCIN-DEXAMETHASONE 0.3-0.1 % OP OINT
TOPICAL_OINTMENT | OPHTHALMIC | Status: AC
  Filled 2017-09-22: qty 3.5

## 2017-09-22 MED ORDER — FENTANYL CITRATE (PF) 250 MCG/5ML IJ SOLN
INTRAMUSCULAR | Status: AC
Start: 2017-09-22 — End: ?
  Filled 2017-09-22: qty 5

## 2017-09-22 MED ORDER — MIDAZOLAM HCL 2 MG/2ML IJ SOLN
INTRAMUSCULAR | Status: AC
  Filled 2017-09-22: qty 2

## 2017-09-22 MED ORDER — LIDOCAINE 2% (20 MG/ML) 5 ML SYRINGE
INTRAMUSCULAR | Status: DC | PRN
  Administered 2017-09-22: 80 mg via INTRAVENOUS

## 2017-09-22 MED ORDER — PROPOFOL 10 MG/ML IV BOLUS
INTRAVENOUS | Status: DC | PRN
  Administered 2017-09-22: 140 mg via INTRAVENOUS

## 2017-09-22 MED ORDER — FENTANYL CITRATE (PF) 250 MCG/5ML IJ SOLN
INTRAMUSCULAR | Status: DC | PRN
  Administered 2017-09-22: 50 ug via INTRAVENOUS

## 2017-09-22 MED ORDER — PHENYLEPHRINE 40 MCG/ML (10ML) SYRINGE FOR IV PUSH (FOR BLOOD PRESSURE SUPPORT)
PREFILLED_SYRINGE | INTRAVENOUS | Status: AC
  Filled 2017-09-22: qty 10

## 2017-09-22 MED ORDER — ROCURONIUM BROMIDE 10 MG/ML (PF) SYRINGE
PREFILLED_SYRINGE | INTRAVENOUS | Status: AC
  Filled 2017-09-22: qty 5

## 2017-09-22 MED ORDER — LIDOCAINE-EPINEPHRINE 1 %-1:100000 IJ SOLN
INTRAMUSCULAR | Status: AC
Start: 2017-09-22 — End: ?
  Filled 2017-09-22: qty 1

## 2017-09-22 MED ORDER — BUPIVACAINE HCL (PF) 0.5 % IJ SOLN
INTRAMUSCULAR | Status: AC
  Filled 2017-09-22: qty 10

## 2017-09-22 MED ORDER — SUGAMMADEX SODIUM 200 MG/2ML IV SOLN
INTRAVENOUS | Status: DC | PRN
  Administered 2017-09-22: 350 mg via INTRAVENOUS

## 2017-09-22 MED ORDER — LACTATED RINGERS IV SOLN: INTRAVENOUS | Status: DC | PRN

## 2017-09-22 MED ORDER — DEXAMETHASONE SODIUM PHOSPHATE 10 MG/ML IJ SOLN
INTRAMUSCULAR | Status: DC | PRN
  Administered 2017-09-22: 10 mg via INTRAVENOUS

## 2017-09-22 MED ORDER — BUPIVACAINE HCL 0.5 % IJ SOLN
INTRAMUSCULAR | Status: DC | PRN
  Administered 2017-09-22: 1 mL

## 2017-09-22 MED ORDER — DEXAMETHASONE SODIUM PHOSPHATE 10 MG/ML IJ SOLN
INTRAMUSCULAR | Status: AC
  Filled 2017-09-22: qty 1

## 2017-09-22 MED ORDER — 0.9 % SODIUM CHLORIDE (POUR BTL) OPTIME
TOPICAL | Status: DC | PRN
  Administered 2017-09-22: 1000 mL

## 2017-09-22 MED ORDER — ROCURONIUM BROMIDE 10 MG/ML (PF) SYRINGE
PREFILLED_SYRINGE | INTRAVENOUS | Status: DC | PRN
  Administered 2017-09-22: 40 mg via INTRAVENOUS

## 2017-09-22 MED ORDER — PHENYLEPHRINE HCL 2.5 % OP SOLN
OPHTHALMIC | Status: AC
  Filled 2017-09-22: qty 2

## 2017-09-22 MED ORDER — TOBRAMYCIN-DEXAMETHASONE 0.3-0.1 % OP OINT
TOPICAL_OINTMENT | OPHTHALMIC | Status: DC | PRN
  Administered 2017-09-22: 1 via OPHTHALMIC

## 2017-09-22 MED ORDER — PHENYLEPHRINE 40 MCG/ML (10ML) SYRINGE FOR IV PUSH (FOR BLOOD PRESSURE SUPPORT)
PREFILLED_SYRINGE | INTRAVENOUS | Status: DC | PRN
  Administered 2017-09-22 (×4): 80 ug via INTRAVENOUS

## 2017-09-22 MED ORDER — ONDANSETRON HCL 4 MG/2ML IJ SOLN
INTRAMUSCULAR | Status: AC
  Filled 2017-09-22: qty 2

## 2017-09-22 MED ORDER — TETRACAINE HCL 0.5 % OP SOLN
OPHTHALMIC | Status: AC
  Filled 2017-09-22: qty 4

## 2017-09-22 MED ORDER — EPHEDRINE SULFATE 50 MG/ML IJ SOLN
INTRAMUSCULAR | Status: DC | PRN
  Administered 2017-09-22: 5 mg via INTRAVENOUS

## 2017-09-22 MED ORDER — SODIUM CHLORIDE 0.9 % IV SOLN
INTRAVENOUS | Status: DC | PRN
  Administered 2017-09-22 (×2): via INTRAVENOUS

## 2017-09-22 MED ORDER — BSS IO SOLN
INTRAOCULAR | Status: AC
  Filled 2017-09-22: qty 15

## 2017-09-22 MED ORDER — ONDANSETRON HCL 4 MG/2ML IJ SOLN
INTRAMUSCULAR | Status: DC | PRN
  Administered 2017-09-22: 4 mg via INTRAVENOUS

## 2017-09-22 SURGICAL SUPPLY — 64 items
APL SRG 3 HI ABS STRL LF PLS (MISCELLANEOUS)
APPLICATOR COTTON TIP 6IN STRL (MISCELLANEOUS) ×4 IMPLANT
APPLICATOR DR MATTHEWS STRL (MISCELLANEOUS) ×1 IMPLANT
BANDAGE EYE OVAL (MISCELLANEOUS) ×3 IMPLANT
BLADE 10 SAFETY STRL DISP (BLADE) ×1 IMPLANT
BLADE SURG 15 STRL LF DISP TIS (BLADE) ×2 IMPLANT
BLADE SURG 15 STRL SS (BLADE) ×4
BNDG ADH 5X2 AIR PERM ELC (GAUZE/BANDAGES/DRESSINGS)
BNDG COHESIVE 2X5 WHT NS (GAUZE/BANDAGES/DRESSINGS) ×1 IMPLANT
CATH URET WHISTLE 8FR 331008 (CATHETERS) ×1 IMPLANT
CATH VISIONS PV 0.018 (CATHETERS) ×1 IMPLANT
CLOSURE STERI-STRIP 1/2X4 (GAUZE/BANDAGES/DRESSINGS) ×1
CLOSURE WOUND 1/2 X4 (GAUZE/BANDAGES/DRESSINGS)
CLSR STERI-STRIP ANTIMIC 1/2X4 (GAUZE/BANDAGES/DRESSINGS) ×3 IMPLANT
CORDS BIPOLAR (ELECTRODE) ×4 IMPLANT
COVER LIGHT HANDLE STERIS (MISCELLANEOUS) ×4 IMPLANT
COVER SURGICAL LIGHT HANDLE (MISCELLANEOUS) ×4 IMPLANT
DRAPE ORTHO SPLIT 87X125 STRL (DRAPES) ×4 IMPLANT
DRAPE SURG 17X23 STRL (DRAPES) ×2 IMPLANT
DRAPE UTILITY XL STRL (DRAPES) ×1 IMPLANT
DRESSING TELFA 8X10 (GAUZE/BANDAGES/DRESSINGS) IMPLANT
ELECT NDL BLADE 2-5/6 (NEEDLE) ×1 IMPLANT
ELECT NEEDLE BLADE 2-5/6 (NEEDLE) ×4 IMPLANT
ELECT REM PT RETURN 9FT ADLT (ELECTROSURGICAL) ×4
ELECTRODE REM PT RTRN 9FT ADLT (ELECTROSURGICAL) ×2 IMPLANT
FORCEPS BIPOLAR SPETZLER 8 1.0 (NEUROSURGERY SUPPLIES) ×4 IMPLANT
FRAME EYE SHIELD (PROTECTIVE WEAR) ×3 IMPLANT
GAUZE SPONGE 4X4 16PLY XRAY LF (GAUZE/BANDAGES/DRESSINGS) ×3 IMPLANT
GLOVE BIO SURGEON STRL SZ7.5 (GLOVE) ×5 IMPLANT
GOWN STRL REUS W/ TWL LRG LVL3 (GOWN DISPOSABLE) ×3 IMPLANT
GOWN STRL REUS W/TWL LRG LVL3 (GOWN DISPOSABLE) ×4
HEMOSTAT SURGICEL 2X14 (HEMOSTASIS) ×1 IMPLANT
KIT BASIN OR (CUSTOM PROCEDURE TRAY) ×4 IMPLANT
KIT TURNOVER KIT B (KITS) ×4 IMPLANT
NDL PRECISIONGLIDE 27X1.5 (NEEDLE) IMPLANT
NEEDLE PRECISIONGLIDE 27X1.5 (NEEDLE) IMPLANT
NS IRRIG 1000ML POUR BTL (IV SOLUTION) ×4 IMPLANT
PACK CATARACT CUSTOM (CUSTOM PROCEDURE TRAY) ×4 IMPLANT
PAD ARMBOARD 7.5X6 YLW CONV (MISCELLANEOUS) ×8 IMPLANT
PENCIL BUTTON HOLSTER BLD 10FT (ELECTRODE) ×4 IMPLANT
SPECIMEN JAR SMALL (MISCELLANEOUS) ×4 IMPLANT
STRIP CLOSURE SKIN 1/2X4 (GAUZE/BANDAGES/DRESSINGS) ×1 IMPLANT
SUT CHROMIC 5 0 P 3 (SUTURE) ×1 IMPLANT
SUT CHROMIC 6 0 PS 4 (SUTURE) ×1 IMPLANT
SUT ETHILON 6 0 P 1 (SUTURE) IMPLANT
SUT ETHILON 7 0 P 6 18 (SUTURE) ×1 IMPLANT
SUT MERSILENE 5 0 RD 1 DA (SUTURE) ×1 IMPLANT
SUT PLAIN 5 0 P 3 18 (SUTURE) ×1 IMPLANT
SUT PLAIN 6 0 TG1408 (SUTURE) IMPLANT
SUT PROLENE 6 0 C 1 24 (SUTURE) ×1 IMPLANT
SUT SILK 2 0 (SUTURE)
SUT SILK 2-0 18XBRD TIE 12 (SUTURE) ×1 IMPLANT
SUT SILK 4 0 P 3 (SUTURE) ×7 IMPLANT
SUT VIC AB 4-0 P-3 18X BRD (SUTURE) ×1 IMPLANT
SUT VIC AB 4-0 P3 18 (SUTURE)
SUT VIC AB 5-0 DAS24 8 (SUTURE) ×1 IMPLANT
SUT VICRYL 6-0 S14 (SUTURE) IMPLANT
SUT VICRYL 7 0 TG140 8 (SUTURE) ×1 IMPLANT
SUT VICRYL ABS 5-0 PS5 18IN (SUTURE) ×1 IMPLANT
SYR 50ML LL SCALE MARK (SYRINGE) ×1 IMPLANT
TOWEL OR 17X24 6PK STRL BLUE (TOWEL DISPOSABLE) ×8 IMPLANT
TUBING BULK SUCTION (MISCELLANEOUS) ×1 IMPLANT
WATER STERILE IRR 1000ML POUR (IV SOLUTION) ×1 IMPLANT
YANKAUER SUCT BULB TIP NO VENT (SUCTIONS) ×1 IMPLANT

## 2017-09-22 NOTE — Anesthesia Preprocedure Evaluation (Addendum)
Anesthesia Evaluation  Patient identified by MRN, date of birth, ID band Patient awake    Reviewed: Allergy & Precautions, H&P , NPO status , Patient's Chart, lab work & pertinent test results  Airway Mallampati: II  TM Distance: >3 FB Neck ROM: Full    Dental no notable dental hx. (+) Teeth Intact, Dental Advisory Given   Pulmonary neg pulmonary ROS,    Pulmonary exam normal breath sounds clear to auscultation       Cardiovascular negative cardio ROS   Rhythm:Regular Rate:Normal     Neuro/Psych Anxiety negative neurological ROS     GI/Hepatic Neg liver ROS, GERD  Medicated and Controlled,  Endo/Other  negative endocrine ROS  Renal/GU negative Renal ROS  negative genitourinary   Musculoskeletal   Abdominal   Peds  Hematology negative hematology ROS (+)   Anesthesia Other Findings   Reproductive/Obstetrics negative OB ROS                            Anesthesia Physical Anesthesia Plan  ASA: II  Anesthesia Plan: General   Post-op Pain Management:    Induction: Intravenous  PONV Risk Score and Plan: 4 or greater and Ondansetron, Dexamethasone and Midazolam  Airway Management Planned: Oral ETT  Additional Equipment:   Intra-op Plan:   Post-operative Plan: Extubation in OR  Informed Consent: I have reviewed the patients History and Physical, chart, labs and discussed the procedure including the risks, benefits and alternatives for the proposed anesthesia with the patient or authorized representative who has indicated his/her understanding and acceptance.   Dental advisory given  Plan Discussed with: CRNA  Anesthesia Plan Comments:         Anesthesia Quick Evaluation

## 2017-09-22 NOTE — Transfer of Care (Signed)
Immediate Anesthesia Transfer of Care Note  Patient: Shelly Beltran  Procedure(s) Performed: RIGHT ORBITOTOMY (Right Eye) EXCISIONAL BIOPSY RIGHT ORBIT (Right Eye)  Patient Location: PACU  Anesthesia Type:General  Level of Consciousness: drowsy and patient cooperative  Airway & Oxygen Therapy: Patient Spontanous Breathing and Patient connected to face mask oxygen  Post-op Assessment: Report given to RN and Post -op Vital signs reviewed and stable  Post vital signs: Reviewed and stable  Last Vitals:  Vitals Value Taken Time  BP 134/71 09/22/2017 12:06 PM  Temp    Pulse 86 09/22/2017 12:08 PM  Resp 22 09/22/2017 12:08 PM  SpO2 100 % 09/22/2017 12:08 PM  Vitals shown include unvalidated device data.  Last Pain:  Vitals:   09/22/17 0926  TempSrc: Oral  PainSc: 0-No pain      Patients Stated Pain Goal: 0 (81/85/90 9311)  Complications: No apparent anesthesia complications

## 2017-09-22 NOTE — Anesthesia Procedure Notes (Signed)
Procedure Name: Intubation Date/Time: 09/22/2017 11:10 AM Performed by: Freddie Breech, CRNA Pre-anesthesia Checklist: Patient identified, Emergency Drugs available, Suction available and Patient being monitored Patient Re-evaluated:Patient Re-evaluated prior to induction Oxygen Delivery Method: Circle System Utilized Preoxygenation: Pre-oxygenation with 100% oxygen Induction Type: IV induction Ventilation: Mask ventilation without difficulty Laryngoscope Size: 3 and Mac Grade View: Grade III Tube type: Oral Tube size: 7.0 mm Number of attempts: 1 Airway Equipment and Method: Stylet and Oral airway Placement Confirmation: ETT inserted through vocal cords under direct vision,  positive ETCO2 and breath sounds checked- equal and bilateral Secured at: 21 cm Tube secured with: Tape Dental Injury: Teeth and Oropharynx as per pre-operative assessment

## 2017-09-22 NOTE — H&P (Signed)
Subjective:    Shelly Beltran is a 62 y.o. female who presents for evaluation of right lower eyelid swelling. The pain is described as none. Onset was several months ago. Symptoms have been unchanged since.   Review of Systems Pertinent items noted in HPI and remainder of comprehensive ROS otherwise negative.    Objective:   BP (!) 147/78   Pulse 72   Temp (!) 97.5 F (36.4 C) (Oral)   Resp 18   Ht 5' (1.524 m)   Wt 78 kg (172 lb)   SpO2 99%   BMI 33.59 kg/m   General:  alert, cooperative and appears stated age Skin:  normal Eyes: conjunctivae/corneas clear. PERRL, EOM's intact. Fundi benign., right lower eyelid with moderately tense swelling laterally Mouth: MMM no lesions Lymph Nodes:  Cervical, supraclavicular, and axillary nodes normal. Lungs:  clear to auscultation bilaterally Heart:  regular rate and rhythm, S1, S2 normal, no murmur, click, rub or gallop Abdomen: soft, non-tender; bowel sounds normal; no masses,  no organomegaly CVA:  absent Genitourinary: defer exam Extremities:  extremities normal, atraumatic, no cyanosis or edema Neurologic:  negative Psychiatric:  normal mood, behavior, speech, dress, and thought processes    Assessment: Right Orbital Mass   Plan: Orbital Exploration, orbitotomy, orbiculectomy, excisional biopsy right eye   1. Discussed the risk of surgery,  and the risks of general anesthetic including MI, CVA, sudden death or even reaction to anesthetic medications. The patient understands the risks, any and all questions were answered to the patient's satisfaction. 2. Follow up: 1 day.  Date of Surgery Update (To be completed by Attending Surgeon day of surgery.)

## 2017-09-22 NOTE — Anesthesia Postprocedure Evaluation (Signed)
Anesthesia Post Note  Patient: Shelly Beltran  Procedure(s) Performed: RIGHT ORBITOTOMY (Right Eye) EXCISIONAL BIOPSY RIGHT ORBIT (Right Eye)     Patient location during evaluation: PACU Anesthesia Type: General Level of consciousness: awake and alert Pain management: pain level controlled Vital Signs Assessment: post-procedure vital signs reviewed and stable Respiratory status: spontaneous breathing, nonlabored ventilation and respiratory function stable Cardiovascular status: blood pressure returned to baseline and stable Postop Assessment: no apparent nausea or vomiting Anesthetic complications: no    Last Vitals:  Vitals:   09/22/17 1220 09/22/17 1237  BP: 122/69 127/71  Pulse: 74 73  Resp: 13 14  Temp:  (!) 36.3 C  SpO2: 100% 97%    Last Pain:  Vitals:   09/22/17 1237  TempSrc:   PainSc: 0-No pain                 Zoye Chandra,W. EDMOND

## 2017-09-22 NOTE — Op Note (Signed)
Procedure(s): WITH EXCISION /EXPLORATION OF EYE SOCKET ORBITAL LESION EXCISION BENIGN LESION REMOVAL OFTHE FACE Procedure Note  Shelly Beltran female 63 y.o. 09/22/2017  Procedure(s) and Anesthesia Type:    * WITH EXCISION /EXPLORATION OF EYE SOCKET - General    * ORBITAL LESION EXCISION - General  Surgeon(s) and Role:    * Abugo, Peyton Najjar, MD - Primary   Indications: Patient is aware that they have a right orbital mass..  The patient now presents for orbital exploration, orbitotomy, and excisional biopsy after discussing therapeutic alternatives.        Surgeon: Clista Bernhardt   Assistants: none  Anesthesia: General endotracheal anesthesia and Local anesthesia 1% buffered lidocaine, 0.25.% bupivacaine, with epinephrine  ASA Class: 1  Procedure Detail  ORBITOTOMY WITH REMOVAL OF LESION RIGHT EYE AND EXPLORATION OF EYE SOCKET Patient is aware that they have a right orbital mass. The risks and benefits of the procedure were reviewed with the patient including bleeding, infection, implant extrusion, scarring, and loss of life. The patient understands and agrees to proceed with a tumor excision of the right eye.  The patient was transported to the operating room in supine position where they were prepped and draped in the standard fashion for oculoplastic surgery. Attention was turned to the right eye which was re-confirmed to the be the correct eye.   A local block was placed in the area of planned incision.          Attention was then directed to the right lower eyelid which had been previously injected with the standard oculoplastic block.  A standard bovie incision was performed after 6-0 nylon suture was placed through the gray line to allow inferior retraction of the lower eyelid. The dissection was carried out to the level of the fat pads. The periosteum was released at the level of the arcus marginalis to ablate the demarcation. Orbital exploration was undertaken medial  and laterally. The abnormally prolapsing fat temporally was removed and sent for pathology. Hemostasis was achieved with a combination of monopolar and bipolar cautery. Then a pressure patch was placed.   The patient tolerated the procedure well and returned to the post anesthesia care unit in good condition.   Findings:   Estimated Blood Loss:  Minimal         Drains: none         Total IV Fluids: <2L  Blood Given: none          Specimens: orbital mass         Implants: none        Complications:  * No complications entered in OR log *         Disposition: PACU - hemodynamically stable.         Condition: stable

## 2017-09-22 NOTE — Discharge Instructions (Signed)
Keep patch on for 24 Hrs. Do not get the patch wet.  °Sleep on back with head elevated. °MD will remove/change the dressings at the post operative appointment.  °

## 2017-09-22 NOTE — Brief Op Note (Signed)
09/22/2017  10:59 AM  PATIENT:  Karle Barr  62 y.o. female  PRE-OPERATIVE DIAGNOSIS:  ORBITAL MASS  POST-OPERATIVE DIAGNOSIS:  ORBITAL MASS  PROCEDURE:  Procedure(s): WITH EXCISION /EXPLORATION OF EYE SOCKET (Right) ORBITAL LESION EXCISION (Right)  SURGEON:  Surgeon(s) and Role:    * Mandisa Persinger, Peyton Najjar, MD - Primary  PHYSICIAN ASSISTANT: none  ASSISTANTS: none   ANESTHESIA:   local and general  EBL:  minimal   BLOOD ADMINISTERED:none  DRAINS: none   LOCAL MEDICATIONS USED:  BUPIVICAINE  and LIDOCAINE   SPECIMEN:  Source of Specimen:  Right orbit  DISPOSITION OF SPECIMEN:  PATHOLOGY  COUNTS:  YES  TOURNIQUET:  * No tourniquets in log *  DICTATION: .Note written in EPIC  PLAN OF CARE: Discharge to home after PACU  PATIENT DISPOSITION:  PACU - hemodynamically stable.   Delay start of Pharmacological VTE agent (>24hrs) due to surgical blood loss or risk of bleeding: yes

## 2017-09-24 ENCOUNTER — Encounter (HOSPITAL_COMMUNITY): Payer: Self-pay | Admitting: Oculoplastics Ophthalmology

## 2017-10-24 IMAGING — CR DG CHEST 2V
2 series · 2 of 2 positions shown · non-contrast
Comparison: 09/10/2016

CLINICAL DATA: Burning sensation in chest multiple times today
since 2499 hours, associated dizziness

EXAM:
CHEST  2 VIEW

[w chest pa]
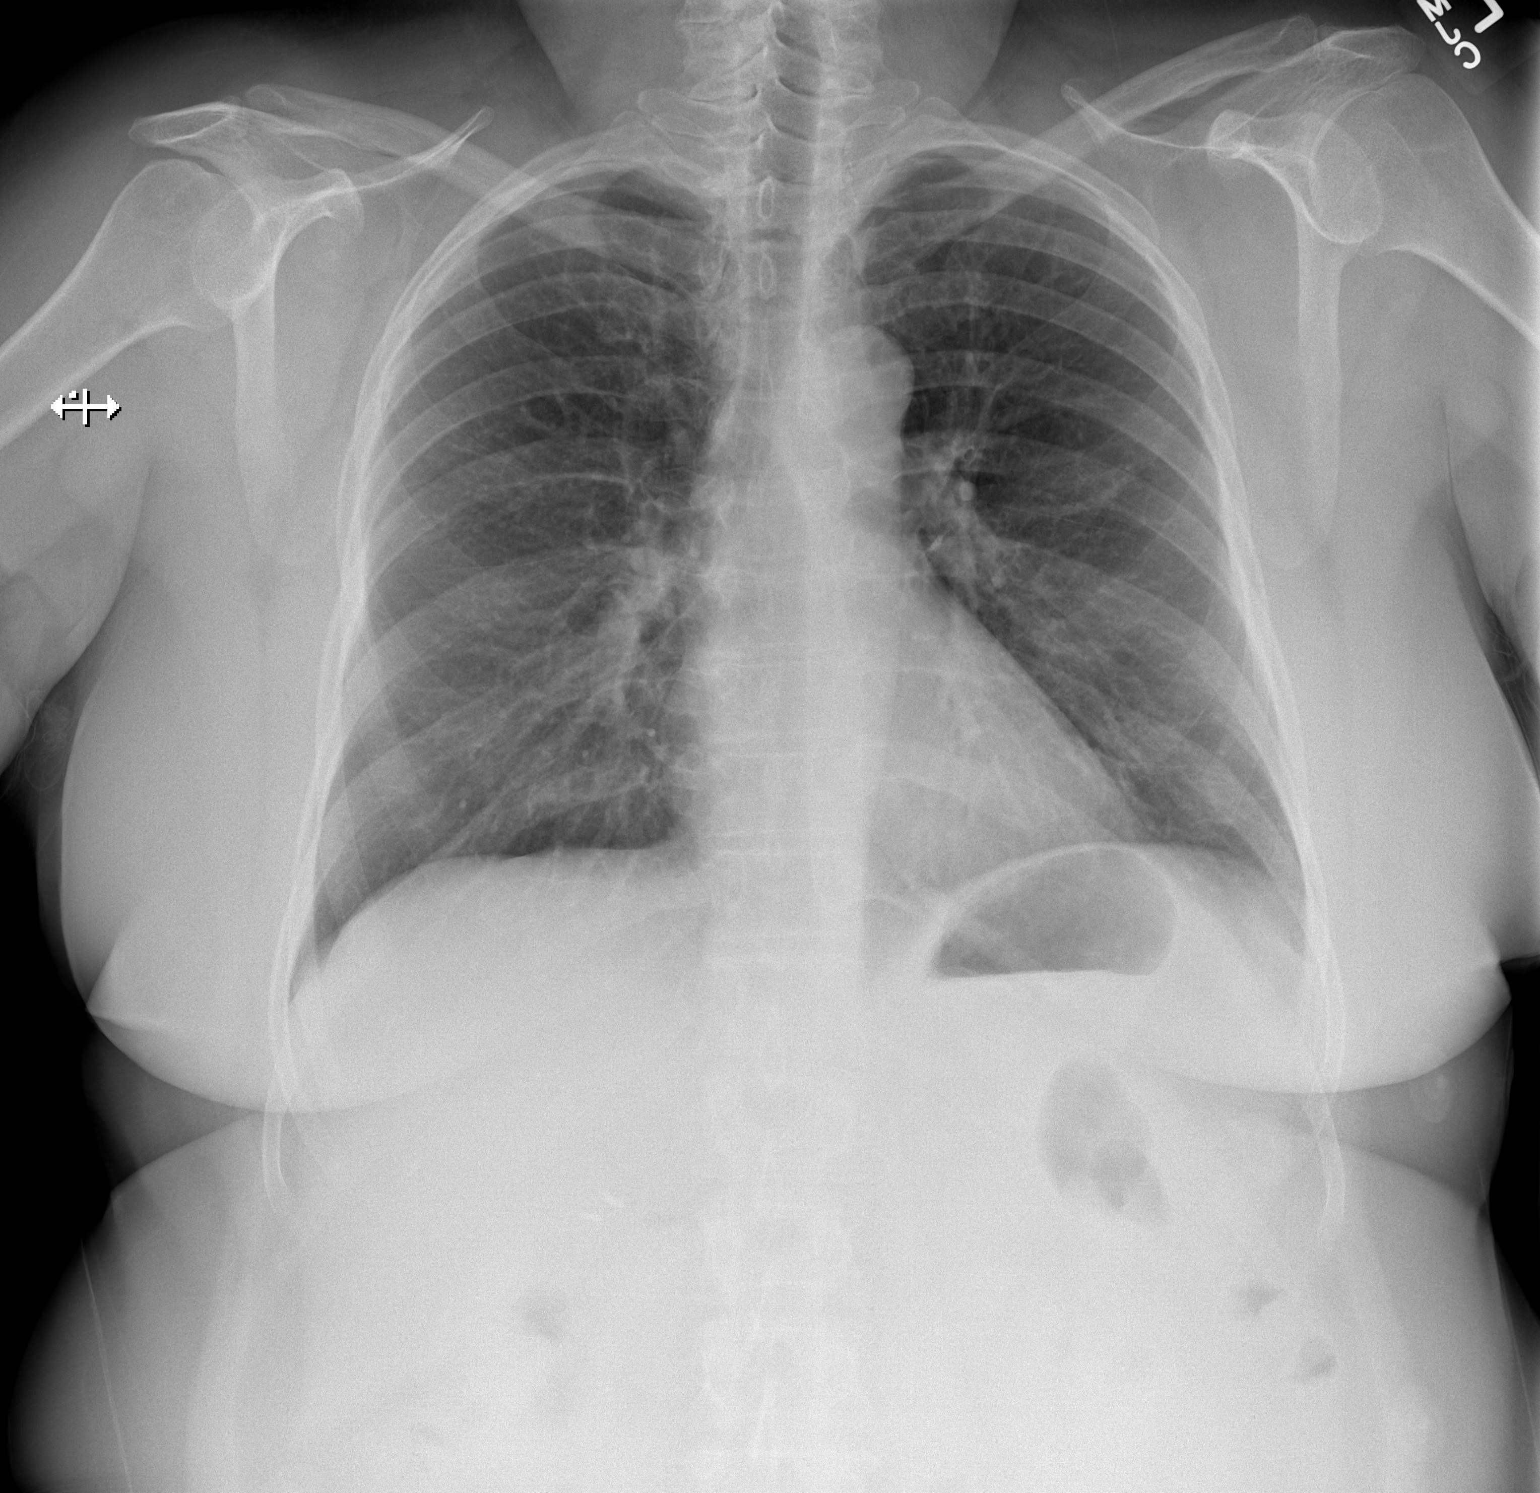

[w chest lat]
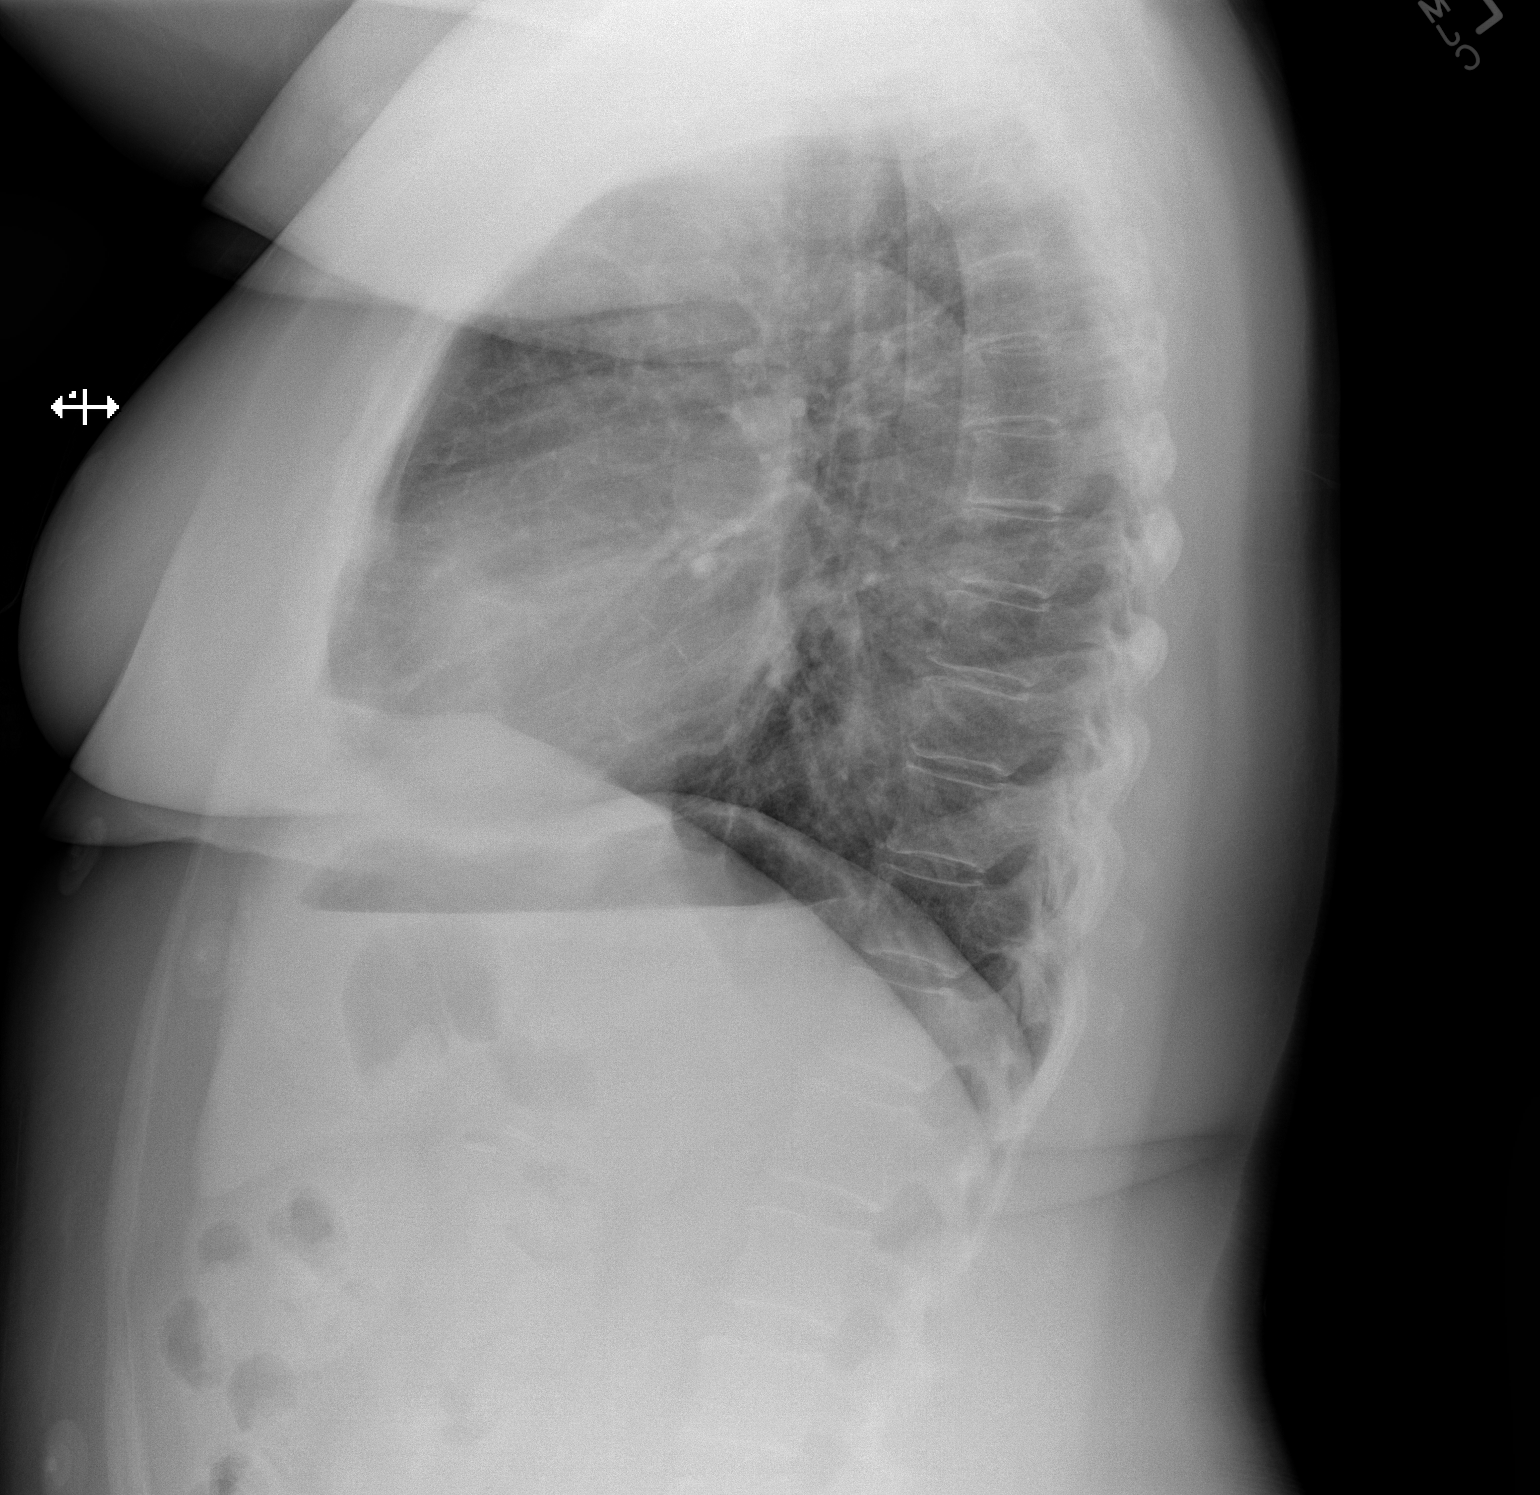

[2 of 2 positions shown; findings below may reference images not displayed]

FINDINGS: Normal heart size, mediastinal contours, and pulmonary vascularity.

Minimal peribronchial thickening.

No pulmonary infiltrate, pleural effusion, or pneumothorax.

Bones unremarkable.
IMPRESSION: Minimal bronchitic changes without infiltrate.

## 2018-08-14 IMAGING — CT CT ORBITS W/ CM
3 of 4 series · 14 of 47 positions shown, 16 images · IV contrast (Omni 300)
Comparison: None.

CLINICAL DATA: Mass in the orbit.

Patient reports right-sided swelling after injection.
EXAM:
CT ORBITS WITH CONTRAST
TECHNIQUE: Multidetector CT images was performed according to the standard
protocol following intravenous contrast administration.
CONTRAST:  75mL [id number] IOPAMIDOL ([id number]) INJECTION 61%

[Series 3: facialbone 2.0 st · axial · 0.35mm/px · z∈[-95,-19]mm · 8 of 46 slices shown, 10 images]
[im 4/46  brain]
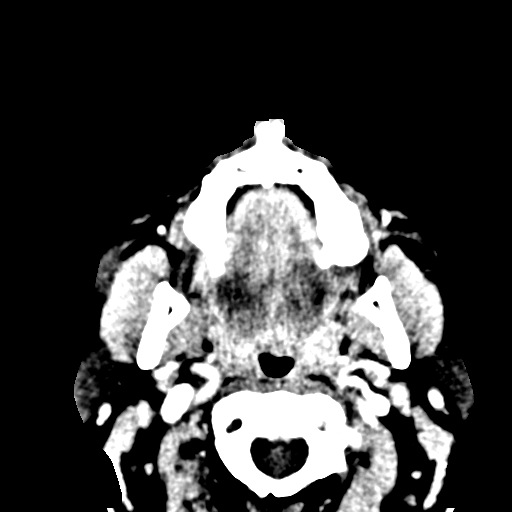
[im 4/46  bone]
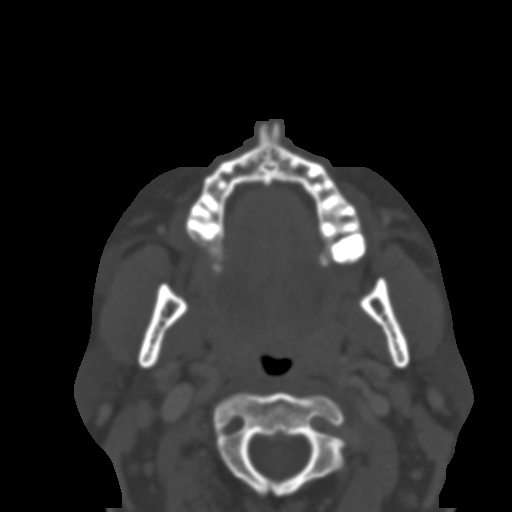
[im 10/46  bone]
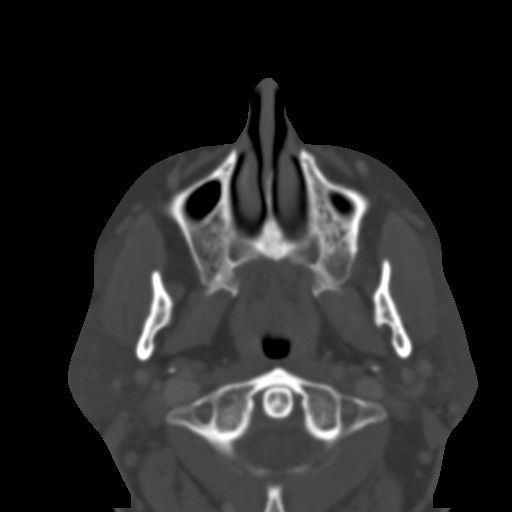
[im 14/46  bone]
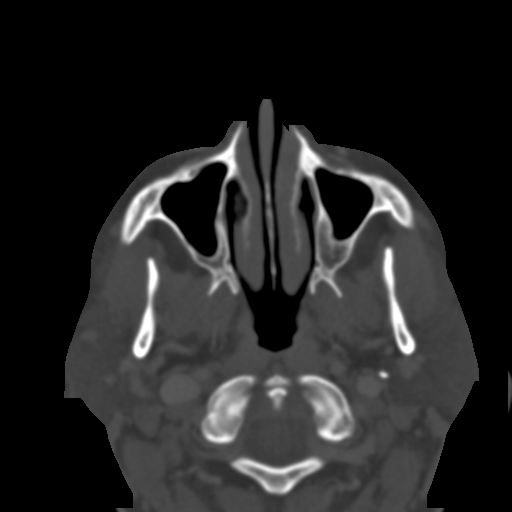
[im 21/46  bone]
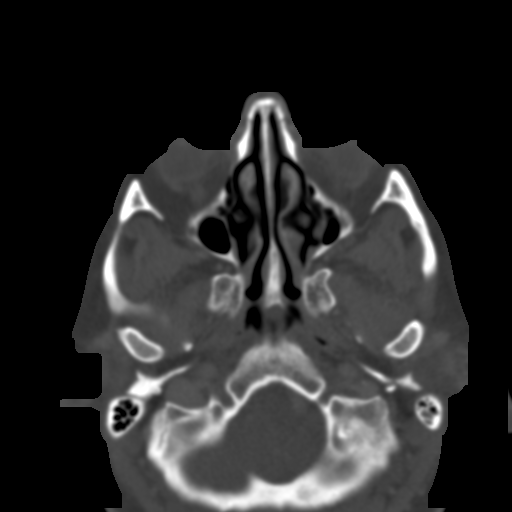
[im 25/46  brain]
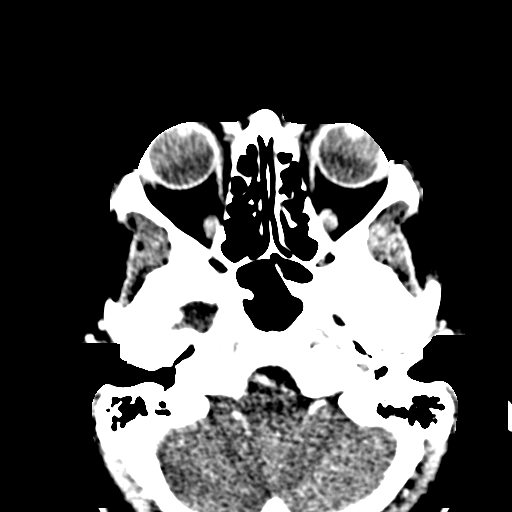
[im 25/46  bone]
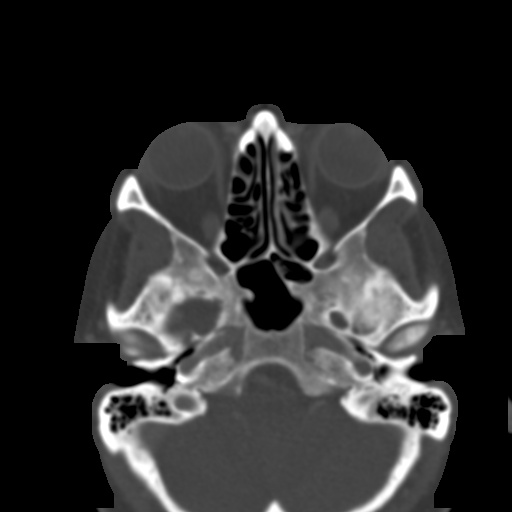
[im 32/46  bone]
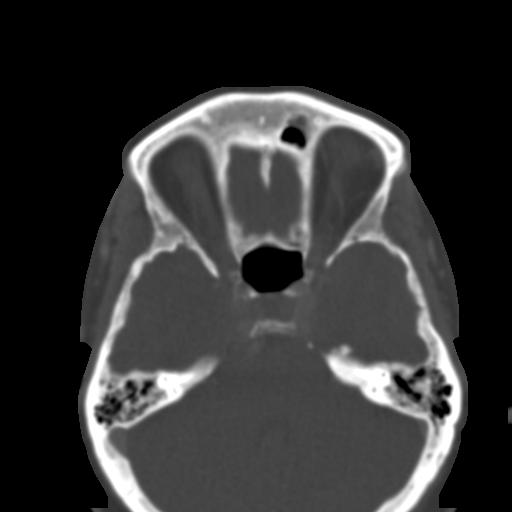
[im 36/46  bone]
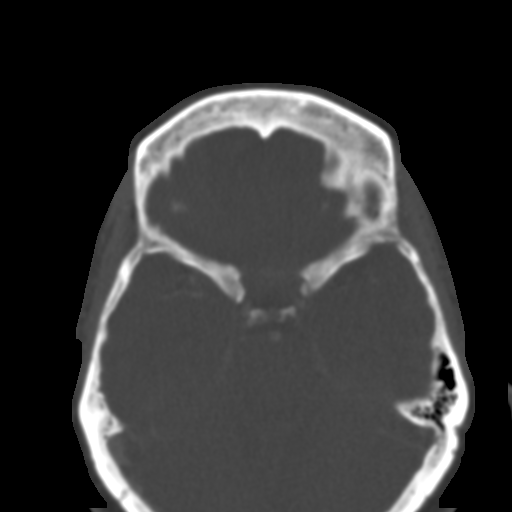
[im 42/46  bone]
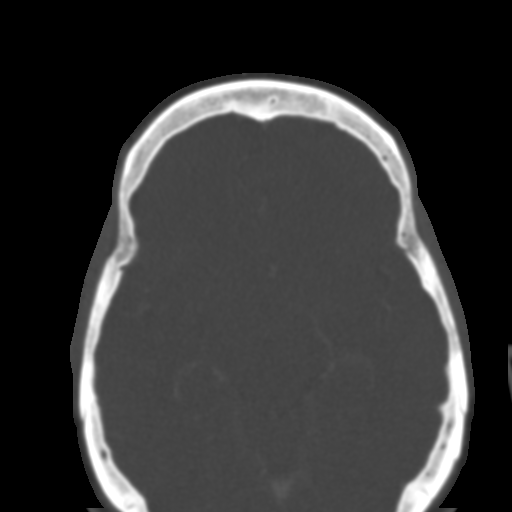

[Series 5: bone 2.0 cor · coronal · 0.18mm/px · 3 of 76 slices shown]
[im 19/76  bone]
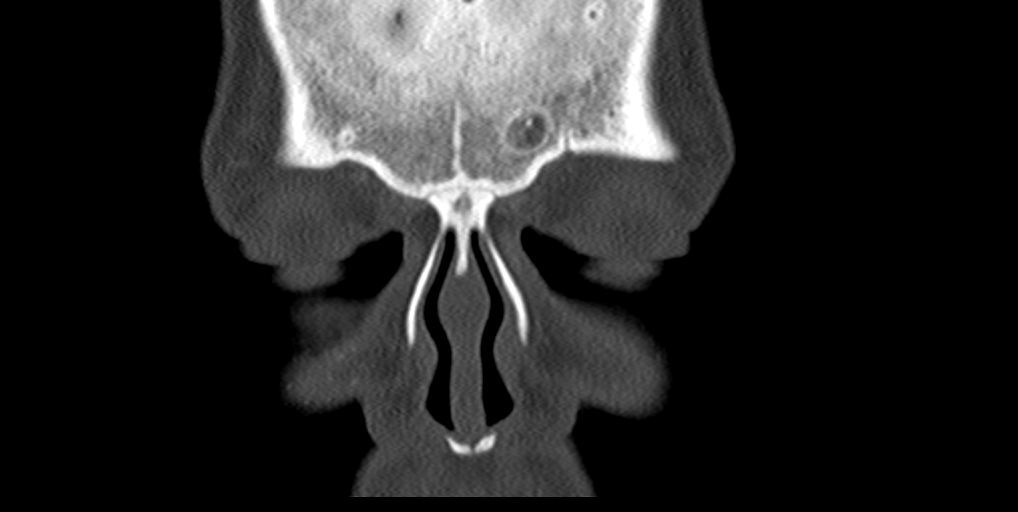
[im 38/76  bone]
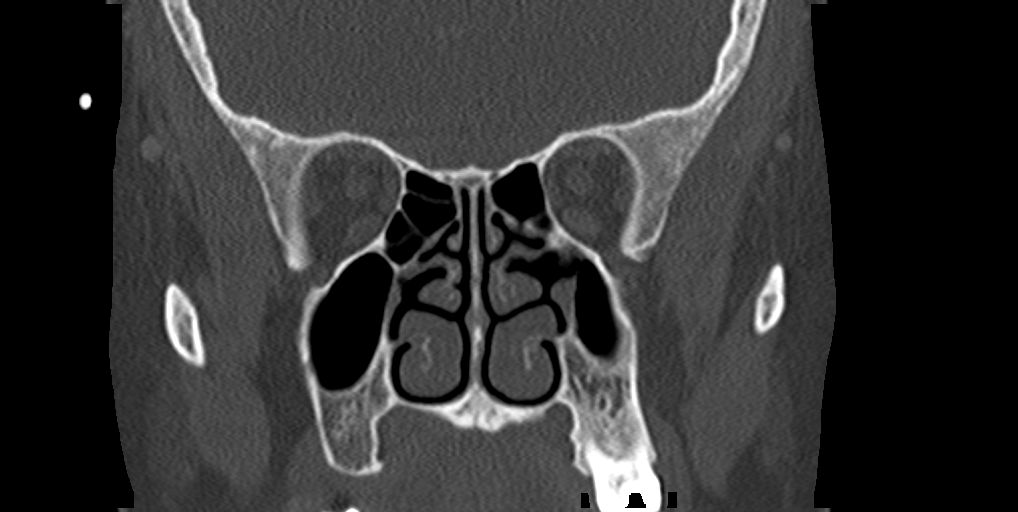
[im 57/76  bone]
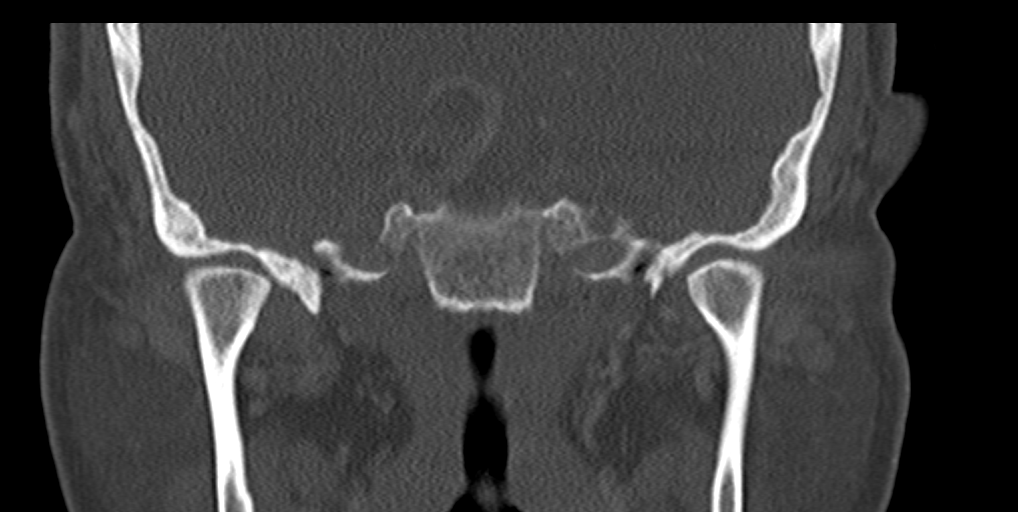

[Series 8: facialbone 2.0 sag st · sagittal · 0.22mm/px · 3 of 90 slices shown]
[im 30/90  bone]
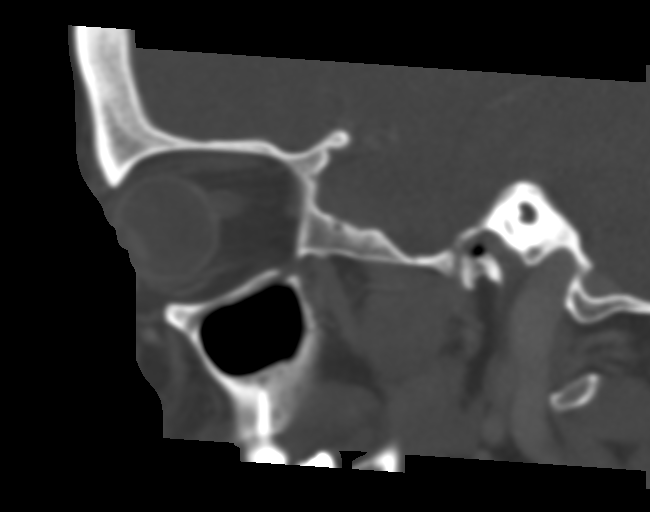
[im 45/90  bone]
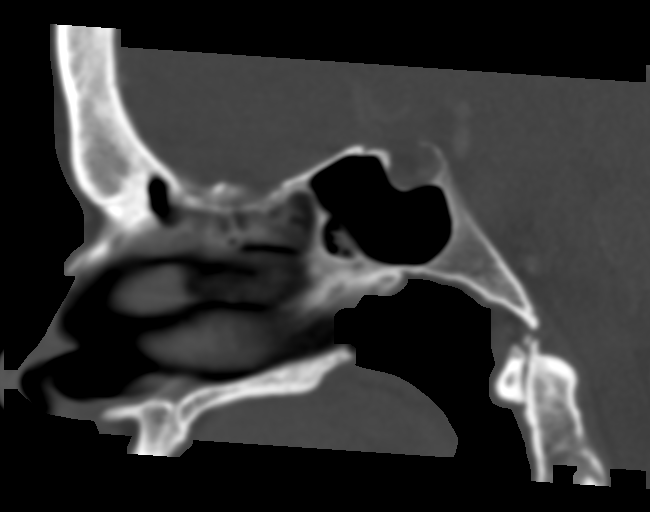
[im 60/90  bone]
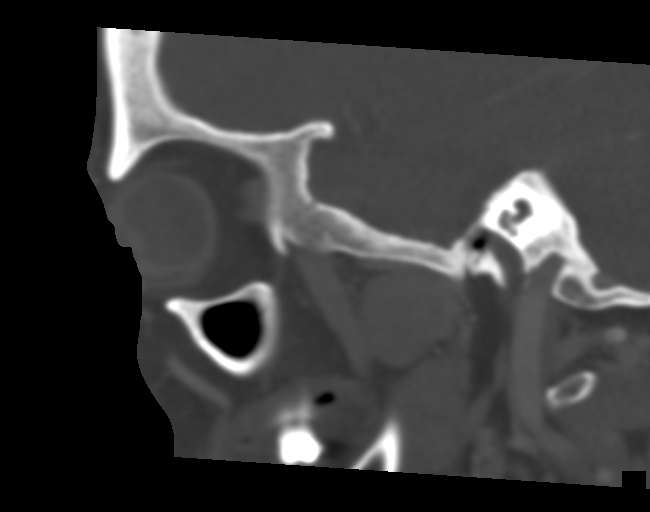

[14 of 47 positions shown; findings below may reference images not displayed]

FINDINGS: Orbits: Normal. Symmetric normal appearance of the globes, optic
nerve sheath complexes, orbital fat, extraocular muscles, and
lacrimal glands. Symmetric normal superior ophthalmic vein
appearance.

Visualized sinuses: Clear

Soft tissues: Negative.

Limited intracranial: Negative
IMPRESSION: Negative exam.  No explanation for history of swelling.

## 2018-12-21 ENCOUNTER — Other Ambulatory Visit: Payer: Self-pay

## 2018-12-21 ENCOUNTER — Emergency Department (HOSPITAL_COMMUNITY)
Admission: EM | Admit: 2018-12-21 | Discharge: 2018-12-22 | Disposition: A | Discharge status: Home / Self Care | Payer: Medicare Other | Attending: Emergency Medicine | Admitting: Emergency Medicine

## 2018-12-21 ENCOUNTER — Emergency Department (HOSPITAL_COMMUNITY): Payer: Medicare Other

## 2018-12-21 DIAGNOSIS — R0602 Shortness of breath: Secondary | ICD-10-CM | POA: Insufficient documentation

## 2018-12-21 DIAGNOSIS — Z5321 Procedure and treatment not carried out due to patient leaving prior to being seen by health care provider: Secondary | ICD-10-CM | POA: Diagnosis not present

## 2018-12-21 MED ORDER — ONDANSETRON 4 MG PO TBDP
4.0000 mg | ORAL_TABLET | Freq: Once | ORAL | Status: DC
  Filled 2018-12-21: qty 1

## 2018-12-21 NOTE — ED Triage Notes (Signed)
Per pt she has been having constipation for a month or more and has been seeing her dr but Shelly Beltran had severe abdominal and chest pain. Pt says she has not been able to move her bowels a couple days. No diarrhea, no vomiting.

## 2018-12-22 DIAGNOSIS — R0602 Shortness of breath: Secondary | ICD-10-CM | POA: Diagnosis not present

## 2018-12-22 LAB — CBC
HCT: 41.1 % (ref 36.0–46.0)
Hemoglobin: 13.3 g/dL (ref 12.0–15.0)
MCH: 28.9 pg (ref 26.0–34.0)
MCHC: 32.4 g/dL (ref 30.0–36.0)
MCV: 89.2 fL (ref 80.0–100.0)
Platelets: 240 10*3/uL (ref 150–400)
RBC: 4.61 MIL/uL (ref 3.87–5.11)
RDW: 13.1 % (ref 11.5–15.5)
WBC: 10 10*3/uL (ref 4.0–10.5)
nRBC: 0 % (ref 0.0–0.2)

## 2018-12-22 LAB — BASIC METABOLIC PANEL
Anion gap: 12 (ref 5–15)
BUN: 16 mg/dL (ref 8–23)
CO2: 25 mmol/L (ref 22–32)
Calcium: 9.2 mg/dL (ref 8.9–10.3)
Chloride: 102 mmol/L (ref 98–111)
Creatinine, Ser: 0.8 mg/dL (ref 0.44–1.00)
GFR calc Af Amer: >60 mL/min (ref >60)
GFR calc non Af Amer: >60 mL/min (ref >60)
Glucose, Bld: 104 mg/dL — ABNORMAL HIGH (ref 70–99)
Potassium: 3.1 mmol/L — ABNORMAL LOW (ref 3.5–5.1)
Sodium: 139 mmol/L (ref 135–145)

## 2018-12-22 LAB — TROPONIN I (HIGH SENSITIVITY)
Troponin I (High Sensitivity): 4 ng/L (ref <18)
Troponin I (High Sensitivity): 4 ng/L (ref <18)

## 2018-12-22 NOTE — ED Notes (Signed)
Pt came up to staff requested that we send their results to her PCP.  Pt stated she was leaving because she felt better.

## 2019-06-14 ENCOUNTER — Encounter (HOSPITAL_COMMUNITY): Payer: Self-pay | Admitting: Emergency Medicine

## 2019-06-14 ENCOUNTER — Emergency Department (HOSPITAL_COMMUNITY)
Admission: EM | Admit: 2019-06-14 | Discharge: 2019-06-14 | Disposition: A | Discharge status: Home / Self Care | Payer: Medicare Other | Attending: Emergency Medicine | Admitting: Emergency Medicine

## 2019-06-14 DIAGNOSIS — Z23 Encounter for immunization: Secondary | ICD-10-CM | POA: Insufficient documentation

## 2019-06-14 DIAGNOSIS — R21 Rash and other nonspecific skin eruption: Secondary | ICD-10-CM | POA: Diagnosis present

## 2019-06-14 DIAGNOSIS — Z5320 Procedure and treatment not carried out because of patient's decision for unspecified reasons: Secondary | ICD-10-CM | POA: Diagnosis not present

## 2019-06-14 DIAGNOSIS — R410 Disorientation, unspecified: Secondary | ICD-10-CM | POA: Diagnosis not present

## 2019-06-14 DIAGNOSIS — Z7982 Long term (current) use of aspirin: Secondary | ICD-10-CM | POA: Insufficient documentation

## 2019-06-14 DIAGNOSIS — Z79899 Other long term (current) drug therapy: Secondary | ICD-10-CM | POA: Diagnosis not present

## 2019-06-14 DIAGNOSIS — R202 Paresthesia of skin: Secondary | ICD-10-CM | POA: Diagnosis not present

## 2019-06-14 LAB — COMPREHENSIVE METABOLIC PANEL
ALT: 19 U/L (ref 0–44)
AST: 23 U/L (ref 15–41)
Albumin: 4.1 g/dL (ref 3.5–5.0)
Alkaline Phosphatase: 120 U/L (ref 38–126)
Anion gap: 9 (ref 5–15)
BUN: 14 mg/dL (ref 8–23)
CO2: 26 mmol/L (ref 22–32)
Calcium: 9.2 mg/dL (ref 8.9–10.3)
Chloride: 103 mmol/L (ref 98–111)
Creatinine, Ser: 0.89 mg/dL (ref 0.44–1.00)
GFR calc Af Amer: >60 mL/min (ref >60)
GFR calc non Af Amer: >60 mL/min (ref >60)
Glucose, Bld: 100 mg/dL — ABNORMAL HIGH (ref 70–99)
Potassium: 3.5 mmol/L (ref 3.5–5.1)
Sodium: 138 mmol/L (ref 135–145)
Total Bilirubin: 0.7 mg/dL (ref 0.3–1.2)
Total Protein: 7.5 g/dL (ref 6.5–8.1)

## 2019-06-14 LAB — CBC WITH DIFFERENTIAL/PLATELET
Abs Immature Granulocytes: 0.01 10*3/uL (ref 0.00–0.07)
Basophils Absolute: 0.1 10*3/uL (ref 0.0–0.1)
Basophils Relative: 1 %
Eosinophils Absolute: 0.1 10*3/uL (ref 0.0–0.5)
Eosinophils Relative: 2 %
HCT: 41.6 % (ref 36.0–46.0)
Hemoglobin: 13.1 g/dL (ref 12.0–15.0)
Immature Granulocytes: 0 %
Lymphocytes Relative: 32 %
Lymphs Abs: 2.6 10*3/uL (ref 0.7–4.0)
MCH: 28.6 pg (ref 26.0–34.0)
MCHC: 31.5 g/dL (ref 30.0–36.0)
MCV: 90.8 fL (ref 80.0–100.0)
Monocytes Absolute: 0.6 10*3/uL (ref 0.1–1.0)
Monocytes Relative: 7 %
Neutro Abs: 4.7 10*3/uL (ref 1.7–7.7)
Neutrophils Relative %: 58 %
Platelets: 261 10*3/uL (ref 150–400)
RBC: 4.58 MIL/uL (ref 3.87–5.11)
RDW: 12.8 % (ref 11.5–15.5)
WBC: 8 10*3/uL (ref 4.0–10.5)
nRBC: 0 % (ref 0.0–0.2)

## 2019-06-14 LAB — PROTIME-INR
INR: 1 (ref 0.8–1.2)
Prothrombin Time: 13.3 seconds (ref 11.4–15.2)

## 2019-06-14 MED ORDER — TETANUS-DIPHTH-ACELL PERTUSSIS 5-2.5-18.5 LF-MCG/0.5 IM SUSP
0.5000 mL | Freq: Once | INTRAMUSCULAR | Status: AC
  Administered 2019-06-14: 0.5 mL via INTRAMUSCULAR
  Filled 2019-06-14: qty 0.5

## 2019-06-14 MED ORDER — HYDROCODONE-ACETAMINOPHEN 5-325 MG PO TABS
1.0000 | ORAL_TABLET | Freq: Once | ORAL | Status: DC
  Filled 2019-06-14: qty 1

## 2019-06-14 MED ORDER — ACETAMINOPHEN 500 MG PO TABS
1000.0000 mg | ORAL_TABLET | Freq: Once | ORAL | Status: AC
  Administered 2019-06-14: 1000 mg via ORAL
  Filled 2019-06-14: qty 2

## 2019-06-14 MED ORDER — PREDNISONE 10 MG PO TABS: 40.0000 mg | ORAL_TABLET | Freq: Every day | ORAL | 0 refills | Status: AC
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
## 2019-06-14 NOTE — Discharge Instructions (Signed)
Thank you for allowing me to care for you today. Please return to the emergency department if you have new or worsening symptoms. Take your medications as instructed.  ° °

## 2019-06-14 NOTE — ED Provider Notes (Signed)
[facility] Provider Note   CSN: [id number] Arrival date & time: 06/14/19  1541     History Chief Complaint  Patient presents with  . [place] is a 64 y.o. female.  Patient is a 64 year old female with past medical history of hyperlipidemia, GERD, macular degeneration who presents the emergency department for snakebite.  Patient reports that 1 week ago she was out in her yard and she came across several black snakes while she was doing yard work.  She is unsure if 1 actually bit her or not.  Reports that she went inside after doing yard work and fell asleep for a few hours and woke up in her dorsum of her left forearm with swollen and painful.  She was told by someone that it was a snake bite.  She is adamant that the snakes were black snakes and were not benefits.  Reports that she went to urgent care today because for the last couple of days she has felt dizzy and felt some palpitations and some tingling and numbness in her arm.  Unknown last tetanus shot.  Denies any chest pain or shortness of breath.  She does report that over the last couple of days her symptoms have actually improved including decreased swelling in her arm and decreased lightheadedness and palpitations.  She also reports that she had a more diffuse rash after working out in your yard which had been itching all over her chest and arms.        Past Medical History:  Diagnosis Date  . Anxiety   . GERD (gastroesophageal reflux disease)    uses Tums as needed  . Hyperlipidemia   . Macular degeneration syndrome     There are no problems to display for this patient.   Past Surgical History:  Procedure Laterality Date  . ABDOMINAL HYSTERECTOMY    . BREAST SURGERY     bilateral breast reduction  . CESAREAN SECTION    . CHOLECYSTECTOMY    . COLONOSCOPY    . ORBITAL LESION EXCISION Right 09/22/2017   Procedure: EXCISIONAL BIOPSY RIGHT ORBIT;  Surgeon:  Clista Bernhardt, MD;  Location: [facility];  Service: Ophthalmology;  Laterality: Right;  . RECONSTRUCTION OF EYELID Right 09/22/2017   Procedure: RIGHT ORBITOTOMY;  Surgeon: Clista Bernhardt, MD;  Location: [facility];  Service: Ophthalmology;  Laterality: Right;     OB History   No obstetric history on file.     Family History  Problem Relation Age of Onset  . Heart attack Mother   . Hypertension Mother   . Diabetes Mother     Social History   Tobacco Use  . Smoking status: Never Smoker  . Smokeless tobacco: Never Used  Substance Use Topics  . Alcohol use: Yes    Comment: social  . Drug use: No    Home Medications Prior to Admission medications   Medication Sig Start Date End Date Taking? Authorizing Provider  aspirin EC 81 MG tablet Take 81 mg by mouth daily.    [provider]  atorvastatin (LIPITOR) 20 MG tablet Take 20 mg by mouth at bedtime.     [provider]  Polyethyl Glycol-Propyl Glycol (SYSTANE) 0.4-0.3 % SOLN Place 1 drop into both eyes 3 (three) times daily as needed (for dry eyes/irritation).    [provider]  predniSONE (DELTASONE) 10 MG tablet Take 4 tablets (40 mg total) by mouth daily for 5 days.  06/14/19 06/19/19  Madilyn Hook A, PA-C  sertraline (ZOLOFT) 100 MG tablet Take 100 mg by mouth daily. 09/05/17   [provider]    Allergies    Patient has no known allergies.  Review of Systems   Review of Systems  Constitutional: Positive for fatigue. Negative for appetite change, chills and fever.  HENT: Negative for congestion and sore throat.   Eyes: Negative for visual disturbance.  Respiratory: Negative for cough and shortness of breath.   Cardiovascular: Positive for palpitations. Negative for chest pain.  Gastrointestinal: Negative for nausea and vomiting.  Genitourinary: Negative for dysuria.  Musculoskeletal: Positive for arthralgias. Negative for back pain.  Skin: Positive for color change and rash.   Allergic/Immunologic: Negative for immunocompromised state.  Neurological: Positive for dizziness, light-headedness and numbness. Negative for headaches.  Hematological: Negative for adenopathy. Does not bruise/bleed easily.  All other systems reviewed and are negative.   Physical Exam Updated Vital Signs BP (!) 138/93 (BP Location: Right Arm)   Pulse 95   Temp 98 F (36.7 C) (Oral)   Resp 18   SpO2 100%   Physical Exam Vitals and nursing note reviewed.  Constitutional:      General: She is not in acute distress.    Appearance: Normal appearance. She is not ill-appearing, toxic-appearing or diaphoretic.  HENT:     Head: Normocephalic.     Mouth/Throat:     Mouth: Mucous membranes are moist.  Eyes:     Conjunctiva/sclera: Conjunctivae normal.  Cardiovascular:     Rate and Rhythm: Normal rate and regular rhythm.  Pulmonary:     Effort: Pulmonary effort is normal.     Breath sounds: Normal breath sounds.  Abdominal:     General: Abdomen is flat.  Musculoskeletal:        General: No tenderness or deformity.  Skin:    General: Skin is dry.     Comments: Dorsum of the distal left forearm with adjacent punctate scabs with surrounding swelling without erythema or drainage. No abscess. Normal distal strength, sensation and pulses.   Neurological:     General: No focal deficit present.     Mental Status: She is alert.  Psychiatric:        Mood and Affect: Mood normal.     ED Results / Procedures / Treatments   Labs (all labs ordered are listed, but only abnormal results are displayed) Labs Reviewed  COMPREHENSIVE METABOLIC PANEL - Abnormal; Notable for the following components:      Result Value   Glucose, Bld 100 (*)    All other components within normal limits  CBC WITH DIFFERENTIAL/PLATELET  PROTIME-INR    EKG EKG Interpretation  Date/Time:  Thursday June 14 2019 16:47:38 EST Ventricular Rate:  93 PR Interval:  140 QRS Duration: 72 QT Interval:  336 QTC  Calculation: 417 R Axis:   32 Text Interpretation: Normal sinus rhythm Normal ECG No STEMI Confirmed by Octaviano Glow [phone]) on 06/14/2019 5:21:55 PM   Radiology No results found.  Procedures Procedures (including critical care time)  Medications Ordered in ED Medications  Tdap (BOOSTRIX) injection 0.5 mL (has no administration in time range)  HYDROcodone-acetaminophen (NORCO/VICODIN) 5-325 MG per tablet 1 tablet (has no administration in time range)    ED Course  I have reviewed the triage vital signs and the nursing notes.  Pertinent labs & imaging results that were available during my care of the patient were reviewed by me and considered in my medical decision making (  see chart for details).  Clinical Course as of Jun 13 1813  Thu Jun 14, 2019  1759 Patient presenting with a question of a snake bite to the dorsum of her right arm from a nonvenomous black snake occurring about 1 week ago.  Also has a constellation of other symptoms including palpitations, confusion, lightheadedness and tingling in the left arm.  Patient's lab work is reassuring including normal CBC, INR and metabolic panel.  Her vitals are also reassuring.  On exam she does have excoriations and rash to her anterior distal left forearm with very mild swelling but no cellulitis or palpable abscess.  She reports this is improved.  Discussed with the patient that it is reassuring that her symptoms are improving.  Also discussed with her that we cannot rule out other causes of her symptoms, specifically her left arm numbness in conjunction with her confusion as well as her palpitations.  I discussed with the patient given her age and history of hyperlipidemia we should do further work-up including cardiac enzymes, chest x-ray and head CT to rule out a stroke.  Patient adamantly declines this offer several times.  She reports "I know that the symptoms are just from the snakebite and I think when it goes away I will be fine."  She reports that she did not even want to come to the emergency department and that urgent care made her come here.  I explained to the patient that we will update her tetanus shot today.  It is also difficult to tell if she was actually bitten by a snake or of this may be a rash and excoriations from a contact dermatitis from being outside.  Given the fact that it does not look infected and she has a more diffuse rash I offered her prednisone which she agreed to take.  I again offered her further work-up for her symptoms but she declined.  Advised her that she can come back to the emergency department at any time and have these tests done.  She reports that she will if she feels like it and that she will follow-up with her primary care doctor. Discussed with Dr. Langston Masker and plan agreed upon   [KM]    Clinical Course User Index [KM] Kristine Royal   MDM Rules/Calculators/A&P                      Based on review of vitals, medical screening exam, lab work and/or imaging, there does not appear to be an acute, emergent etiology for the patient's symptoms. Counseled pt on good return precautions and encouraged both PCP and ED follow-up as needed.  Prior to discharge, I also discussed incidental imaging findings with patient in detail and advised appropriate, recommended follow-up in detail.  Clinical Impression: 1. Rash   2. Paresthesia   3. Confusion     Disposition: Discharge  Prior to providing a prescription for a controlled substance, I independently reviewed the patient's recent prescription history on the [facility]. The patient had no recent or regular prescriptions and was deemed appropriate for a brief, less than 3 day prescription of narcotic for acute analgesia.  This note was prepared with assistance of Systems analyst. Occasional wrong-word or sound-a-like substitutions may have occurred due to the inherent limitations  of voice recognition software.  Final Clinical Impression(s) / ED Diagnoses Final diagnoses:  Rash  Paresthesia  Confusion    Rx / DC Orders  ED Discharge Orders         Ordered    predniSONE (DELTASONE) 10 MG tablet  Daily     06/14/19 1810           Kristine Royal 06/14/19 1815    Wyvonnia Dusky, MD 06/15/19 [phone]

## 2019-06-14 NOTE — ED Notes (Signed)
Patient verbalizes understanding of discharge instructions. Opportunity for questioning and answers were provided. Armband removed by staff, pt discharged from ED.  

## 2020-01-07 IMAGING — CR DG CHEST 2V
2 series · 2 of 2 positions shown · non-contrast
Comparison: Radiograph 06/13/2018

CLINICAL DATA: Chest and abdominal pain

EXAM:
CHEST - 2 VIEW

[chest lat]
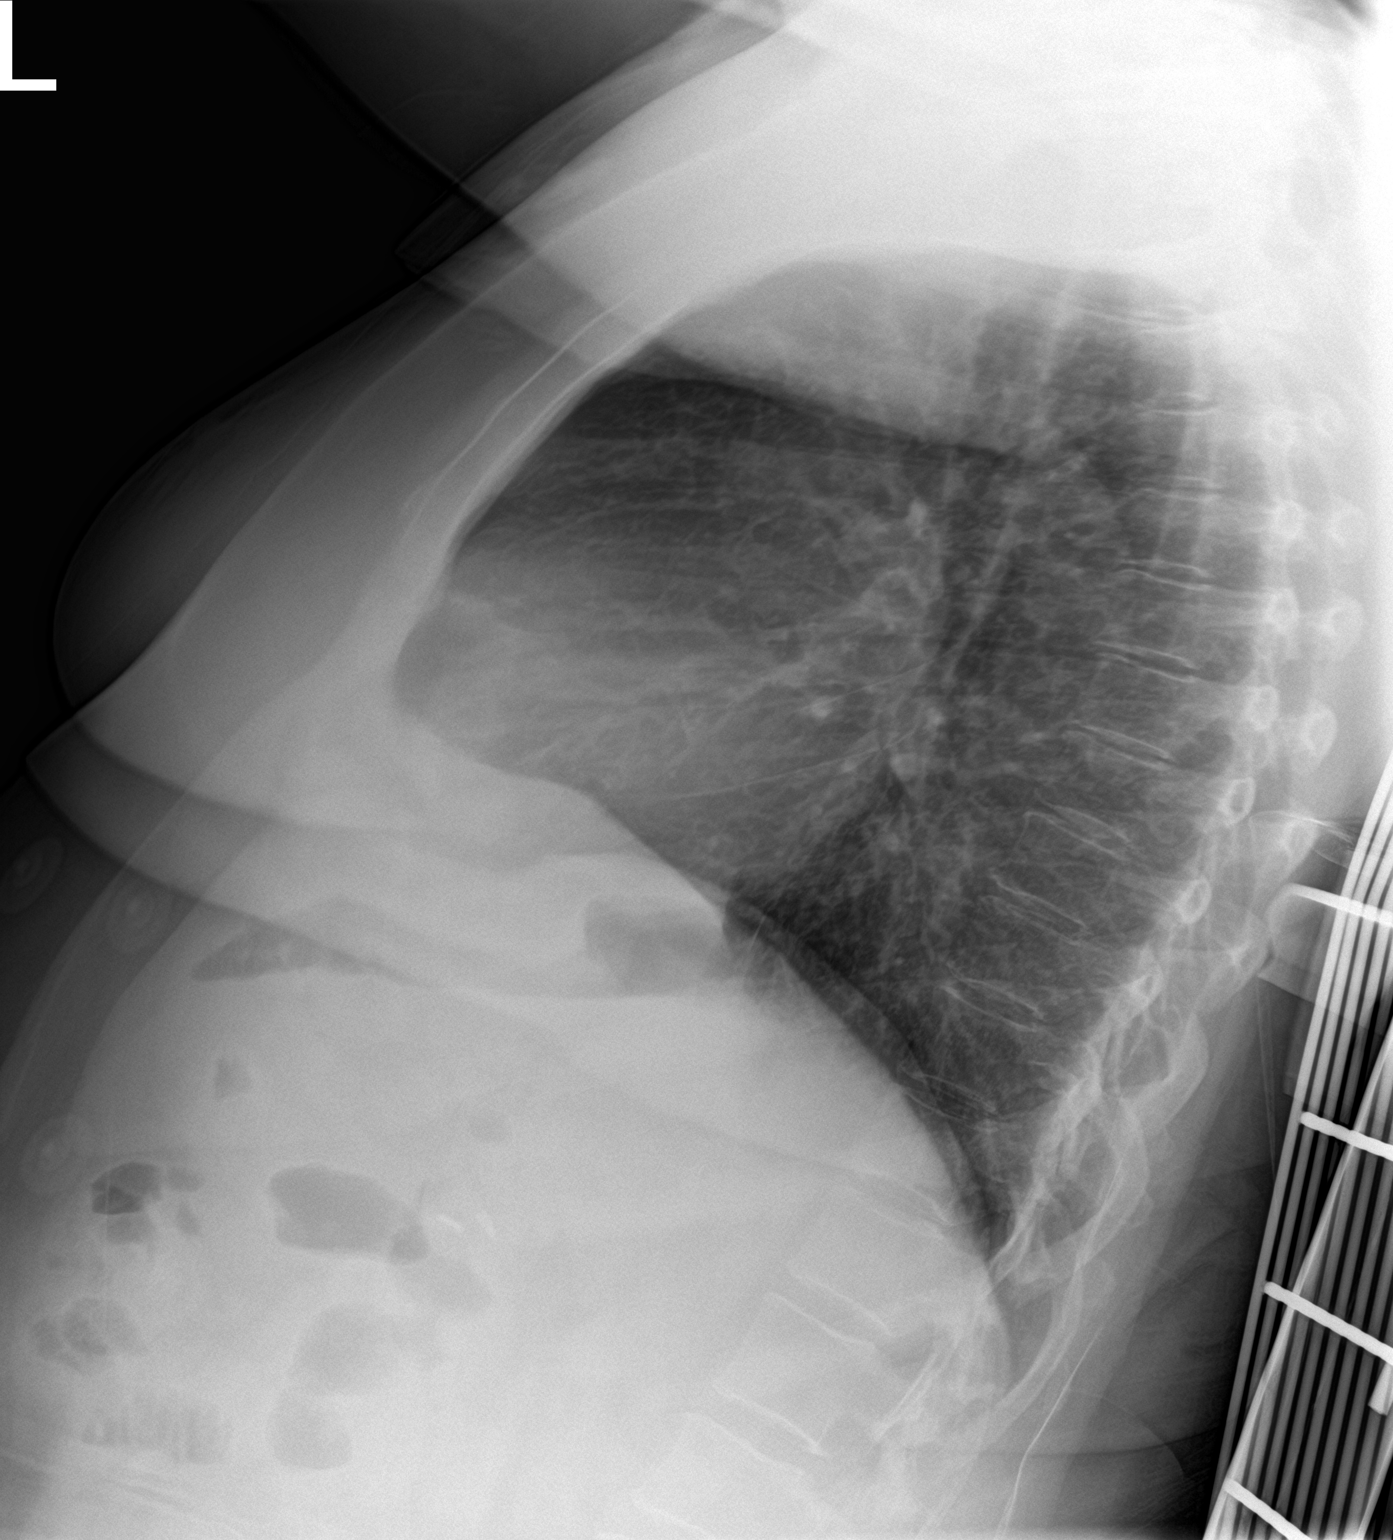

[chest ap]
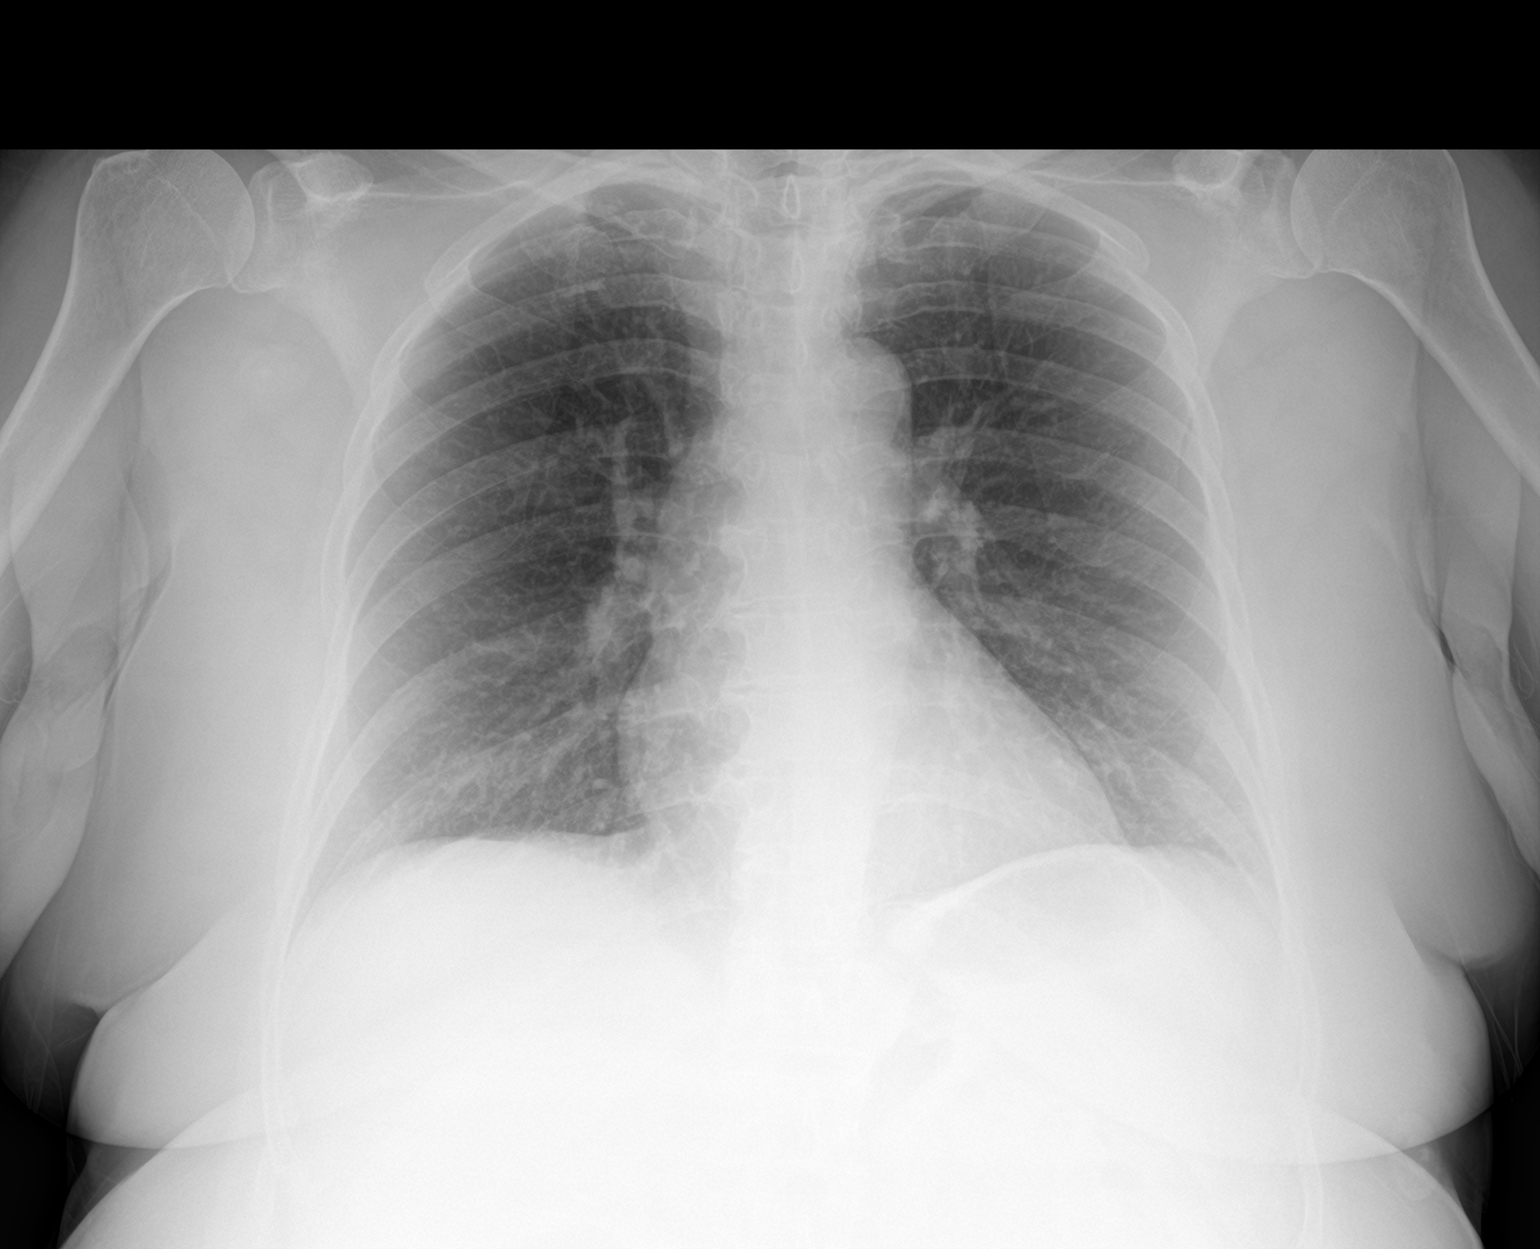

[2 of 2 positions shown; findings below may reference images not displayed]

FINDINGS: The cardiomediastinal contours are normal. The lungs are clear.
Pulmonary vasculature is normal. No consolidation, pleural effusion,
or pneumothorax. No acute osseous abnormalities are seen.
IMPRESSION: No acute chest findings.
